# Patient Record
Sex: Female | Born: 1960
Health system: Southern US, Community
[De-identification: ages and names within clinical notes are randomized; demographics above are authoritative.]

## PROBLEM LIST (undated history)

## (undated) HISTORY — PX: LEEP: SHX91

---

## 2008-01-04 DIAGNOSIS — R011 Cardiac murmur, unspecified: Secondary | ICD-10-CM | POA: Insufficient documentation

## 2014-02-11 DIAGNOSIS — L719 Rosacea, unspecified: Secondary | ICD-10-CM | POA: Insufficient documentation

## 2015-02-14 DIAGNOSIS — F17200 Nicotine dependence, unspecified, uncomplicated: Secondary | ICD-10-CM | POA: Insufficient documentation

## 2016-01-02 DIAGNOSIS — K635 Polyp of colon: Secondary | ICD-10-CM | POA: Diagnosis not present

## 2016-01-02 DIAGNOSIS — Z0000 Encounter for general adult medical examination without abnormal findings: Secondary | ICD-10-CM | POA: Diagnosis not present

## 2016-01-02 DIAGNOSIS — R933 Abnormal findings on diagnostic imaging of other parts of digestive tract: Secondary | ICD-10-CM | POA: Diagnosis not present

## 2016-01-02 DIAGNOSIS — D229 Melanocytic nevi, unspecified: Secondary | ICD-10-CM | POA: Diagnosis not present

## 2016-02-26 DIAGNOSIS — R300 Dysuria: Secondary | ICD-10-CM | POA: Diagnosis not present

## 2016-02-26 DIAGNOSIS — N3001 Acute cystitis with hematuria: Secondary | ICD-10-CM | POA: Diagnosis not present

## 2016-03-03 DIAGNOSIS — R102 Pelvic and perineal pain: Secondary | ICD-10-CM | POA: Diagnosis not present

## 2016-03-03 DIAGNOSIS — R319 Hematuria, unspecified: Secondary | ICD-10-CM | POA: Diagnosis not present

## 2016-03-03 DIAGNOSIS — R1032 Left lower quadrant pain: Secondary | ICD-10-CM | POA: Diagnosis not present

## 2016-03-04 ENCOUNTER — Emergency Department
Admission: EM | Admit: 2016-03-04 | Discharge: 2016-03-04 | Disposition: A | Discharge status: Home / Self Care | Payer: 59 | Source: Home / Self Care | Attending: Family Medicine | Admitting: Family Medicine

## 2016-03-04 ENCOUNTER — Emergency Department (INDEPENDENT_AMBULATORY_CARE_PROVIDER_SITE_OTHER): Payer: 59

## 2016-03-04 ENCOUNTER — Encounter: Payer: Self-pay | Admitting: Emergency Medicine

## 2016-03-04 DIAGNOSIS — I700 Atherosclerosis of aorta: Secondary | ICD-10-CM | POA: Diagnosis not present

## 2016-03-04 DIAGNOSIS — R1032 Left lower quadrant pain: Secondary | ICD-10-CM | POA: Diagnosis not present

## 2016-03-04 DIAGNOSIS — K5730 Diverticulosis of large intestine without perforation or abscess without bleeding: Secondary | ICD-10-CM | POA: Diagnosis not present

## 2016-03-04 DIAGNOSIS — R319 Hematuria, unspecified: Secondary | ICD-10-CM

## 2016-03-04 DIAGNOSIS — K5732 Diverticulitis of large intestine without perforation or abscess without bleeding: Secondary | ICD-10-CM | POA: Diagnosis not present

## 2016-03-04 LAB — POCT URINALYSIS DIP (MANUAL ENTRY)
Bilirubin, UA: NEGATIVE
Glucose, UA: NEGATIVE
Leukocytes, UA: NEGATIVE
Nitrite, UA: NEGATIVE
Protein Ur, POC: NEGATIVE
Spec Grav, UA: 1.01 (ref 1.005–1.03)
Urobilinogen, UA: 0.2 (ref 0–1)
pH, UA: 6.5 (ref 5–8)

## 2016-03-04 MED ORDER — PREDNISONE 20 MG PO TABS: ORAL_TABLET | ORAL | Status: DC

## 2016-03-04 MED ORDER — AMOXICILLIN-POT CLAVULANATE 875-125 MG PO TABS: 1.0000 | ORAL_TABLET | Freq: Two times a day (BID) | ORAL | Status: DC

## 2016-03-04 NOTE — ED Notes (Signed)
Hematuria, dysuria, lower abdominal pain x 1 week

## 2016-03-04 NOTE — ED Provider Notes (Signed)
CSN: [id number]     Arrival date & time 03/04/16  1416 History   First MD Initiated Contact with Patient 03/04/16 1445     Chief Complaint  Patient presents with  . Hematuria   (Consider location/radiation/quality/duration/timing/severity/associated sxs/prior Treatment) HPI The pt is a 55yo female presenting to [facility] with c/o hematuria for about 1 week, mild dysuria and lower abdominal pain.  Pt was seen 1 week ago and treated with Macrobid for a UTI.  She was called by the urgent care about her culture results and was advised she was on the correct antibiotic. She has 2 days left but is concerned there is pain and still blood in her urine.  She went to her OB/GYN earlier this week and was referred to urology due to concern for possible kidney stone.  Urologist recommended a CT abdomen with oral contrast, however, the urologist was not in her insurance network so she declined but is here today as symptoms have not improved. Pain in Left lower abdomen are cramping and sore, 4/10 at this time. Nothing seems to make pain better or worse. Denies vaginal symptoms and states pelvic exam at OB/GYN was normal.  Denies back pain, fever, chills, n/v/d. She does report hx of diverticulitis but notes this feels a little different and also recalls she is on Flagyl along with the [place] but still not improving.  Pt notes she is concerned as she had a friend pass away from bladder cancer and wants to make sure that's not what's causing her pain.    History reviewed. No pertinent past medical history. Past Surgical History  Procedure Laterality Date  . Leep     No family history on file. Social History  Substance Use Topics  . Smoking status: Current Every Day Smoker -- 0.50 packs/day    Types: Cigarettes  . Smokeless tobacco: None  . Alcohol Use: Yes   OB History    No data available     Review of Systems  Constitutional: Negative for fever and chills.  HENT: Negative for congestion, ear pain, sore  throat, trouble swallowing and voice change.   Respiratory: Negative for shortness of breath.   Cardiovascular: Negative for chest pain and palpitations.  Gastrointestinal: Negative for nausea, vomiting, abdominal pain and diarrhea.  Genitourinary: Positive for dysuria and hematuria. Negative for urgency, frequency, flank pain, vaginal bleeding, vaginal discharge, vaginal pain and pelvic pain.  Musculoskeletal: Negative for myalgias, back pain and arthralgias.  Skin: Negative for rash.    Allergies  Avelox and Ciprofloxacin  Home Medications   Prior to Admission medications   Medication Sig Start Date End Date Taking? Authorizing Provider  aspirin 81 MG tablet Take 81 mg by mouth daily.   Yes Historical Provider, MD  metroNIDAZOLE (FLAGYL) 500 MG tablet Take 500 mg by mouth 3 (three) times daily.   Yes Historical Provider, MD  nitrofurantoin (MACRODANTIN) 100 MG capsule Take 100 mg by mouth 4 (four) times daily.   Yes Historical Provider, MD  amoxicillin-clavulanate (AUGMENTIN) 875-125 MG tablet Take 1 tablet by mouth 2 (two) times daily. One po bid x 7 days 03/04/16   Noland Fordyce, PA-C  predniSONE (DELTASONE) 20 MG tablet 3 tabs po day one, then 2 po daily x 4 days 03/04/16   Noland Fordyce, PA-C   Meds Ordered and Administered this Visit  Medications - No data to display  BP 120/75 mmHg  Pulse 84  Temp(Src) 97.9 F (36.6 C) (Oral)  Ht 5\' 1"  (1.549 m)  Wt 129 lb (58.514 kg)  BMI 24.39 kg/m2  SpO2 98% No data found.   Physical Exam  Constitutional: She appears well-developed and well-nourished. No distress.  HENT:  Head: Normocephalic and atraumatic.  Eyes: Conjunctivae are normal. No scleral icterus.  Neck: Normal range of motion.  Cardiovascular: Normal rate, regular rhythm and normal heart sounds.   Pulmonary/Chest: Effort normal and breath sounds normal. No respiratory distress. She has no wheezes. She has no rales. She exhibits no tenderness.  Abdominal: Soft. She  exhibits no distension and no mass. There is tenderness. There is no rebound, no guarding and no CVA tenderness.  Musculoskeletal: Normal range of motion.  Neurological: She is alert.  Skin: Skin is warm and dry. She is not diaphoretic.  Nursing note and vitals reviewed.   ED Course  Procedures (including critical care time)  Labs Review Labs Reviewed  POCT URINALYSIS DIP (MANUAL ENTRY) - Abnormal; Notable for the following:    Ketones, POC UA trace (5) (*)    Blood, UA small (*)    All other components within normal limits    Imaging Review Ct Abdomen Pelvis Wo Contrast  03/04/2016  CLINICAL DATA:  Lower abdominal pain, blood in during for 1 week, refused IV contrast EXAM: CT ABDOMEN AND PELVIS WITHOUT CONTRAST TECHNIQUE: Multidetector CT imaging of the abdomen and pelvis was performed following the standard protocol without IV contrast. COMPARISON:  None. FINDINGS: Lower chest:  Lung bases are unremarkable. Hepatobiliary: Unenhanced liver shows no biliary ductal dilatation. No calcified gallstones are noted within gallbladder. Pancreas: Unenhanced pancreas is unremarkable. Spleen: Unenhanced spleen is unremarkable. Adrenals/Urinary Tract: Unenhanced adrenal glands are unremarkable. Unenhanced kidneys are symmetrical in size. No nephrolithiasis. No hydronephrosis or hydroureter. No calcified ureteral calculi. No calcified calculi are noted within under distended urinary bladder. Stomach/Bowel: Oral contrast material was given to the patient. There is no gastric outlet obstruction. No small bowel obstruction. No thickened or dilated small bowel loops. No pericecal inflammation. Normal appendix noted in axial image 59. The terminal ileum is unremarkable. No colonic obstruction. Colonic diverticula are noted descending colon. Multiple colonic diverticula are noted proximal sigmoid colon. In axial a image 68 there is inflammatory process in distal left colon at the junction with sigmoid colon. There  is focal thickening of colonic wall. Stranding of pericolonic fat. Findings are consistent with focal colitis or diverticulitis. This is best seen in coronal image 30. There is no evidence of extraluminal air. No contrast extravasation is noted. Vascular/Lymphatic: No aortic aneurysm. Atherosclerotic calcifications of abdominal aorta and iliac arteries are noted. No retroperitoneal or mesenteric adenopathy. Reproductive: The unenhanced uterus is anteflexed. No adnexal masses noted. Other: No ascites or free abdominal air.  No inguinal adenopathy. Musculoskeletal: Sagittal images of the spine shows mild degenerative changes lower thoracic and lumbar spine. No destructive bony lesions are noted. IMPRESSION: 1. Colonic diverticula are noted descending colon and sigmoid colon. There is focal inflammatory process at the junction of descending colon with sigmoid colon in left lower quadrant axial image 59 and 60. Focal thickening of colonic wall and stranding of pericolonic fat. Findings are consistent with focal colitis or diverticulitis. There is no definite evidence of perforation. No mesenteric abscess is noted. 2. Normal appendix.  No pericecal inflammation. 3. No nephrolithiasis.  No hydronephrosis or hydroureter. 4. No small bowel obstruction. 5. Atherosclerotic calcifications of abdominal aorta. Electronically Signed   By: Lahoma Crocker M.D.   On: 03/04/2016 16:53     MDM   1. Hematuria  2. Left lower quadrant pain   3. Diverticulitis of colon    Pt c/o Left lower abdominal pain and hematuria. Was recommended she get a CT by a urologist earlier this week. Had a normal pelvic exam by her OB/GYN earlier this week.  Hx of diverticulitis.   CT ordered to r/o diverticulitis vs mass  Pt declined IV contrast with the CT as she is concerned for kidney side effects despite having hx of diabetes or CKD.  CT Abd: c/w diverticulitis and colitis without abscess or perforation.  No other acute findings.  Rx:  Augmentin and prednisone as pt states she has been on Flagyl but no relief of symptoms. Encouraged to take her last dose of Flagyl and Macrobid tonight and then start taking Augmentin. Pt declined stronger pain medication. Will take Advil and acetaminophen.   F/u with PCP next week for recheck of symptoms if not improving. Encouraged to discuss f/u colonoscopy with her PCP.  Resources provided for [facility]. Encouraged to f/u if hematuria continues after completion of antibiotics.   Discussed symptoms that warrant emergent care in the ED. Patient verbalized understanding and agreement with treatment plan.    Noland Fordyce, PA-C 03/04/16 1714

## 2016-03-04 NOTE — Discharge Instructions (Signed)
You may take 400-600mg  Ibuprofen (Motrin) every 6-8 hours for fever and pain  Alternate with Tylenol  You may take 500mg  Tylenol every 4-6 hours as needed for fever and pain  Follow-up with your primary care provider next week for recheck of symptoms if not improving.  Be sure to drink plenty of fluids and rest, at least 8hrs of sleep a night, preferably more while you are sick. Return urgent care or go to closest ER if you cannot keep down fluids/signs of dehydration, fever not reducing with Tylenol, difficulty breathing/wheezing, stiff neck, worsening condition, or other concerns (see below)  Please take antibiotics as prescribed and be sure to complete entire course even if you start to feel better to ensure infection does not come back.   Please be sure to complete your antibiotics that you were prescribed at the other facility as well as the new medication you were prescribed today.  It is recommended you get a colonoscopy after diagnosis of diverticulitis.  Please speak with your primary care provider about following up with a colonoscopy.

## 2016-03-07 ENCOUNTER — Telehealth: Payer: Self-pay

## 2016-04-16 ENCOUNTER — Ambulatory Visit: Payer: 59 | Admitting: Podiatry

## 2016-05-14 DIAGNOSIS — Z1231 Encounter for screening mammogram for malignant neoplasm of breast: Secondary | ICD-10-CM | POA: Diagnosis not present

## 2016-06-04 DIAGNOSIS — R238 Other skin changes: Secondary | ICD-10-CM | POA: Diagnosis not present

## 2016-06-04 DIAGNOSIS — N9089 Other specified noninflammatory disorders of vulva and perineum: Secondary | ICD-10-CM | POA: Diagnosis not present

## 2016-06-04 DIAGNOSIS — N898 Other specified noninflammatory disorders of vagina: Secondary | ICD-10-CM | POA: Diagnosis not present

## 2016-06-04 DIAGNOSIS — Z01419 Encounter for gynecological examination (general) (routine) without abnormal findings: Secondary | ICD-10-CM | POA: Diagnosis not present

## 2016-06-04 DIAGNOSIS — Z1151 Encounter for screening for human papillomavirus (HPV): Secondary | ICD-10-CM | POA: Diagnosis not present

## 2016-07-05 DIAGNOSIS — L578 Other skin changes due to chronic exposure to nonionizing radiation: Secondary | ICD-10-CM | POA: Diagnosis not present

## 2016-07-05 DIAGNOSIS — L821 Other seborrheic keratosis: Secondary | ICD-10-CM | POA: Diagnosis not present

## 2016-07-30 DIAGNOSIS — Z86010 Personal history of colonic polyps: Secondary | ICD-10-CM | POA: Diagnosis not present

## 2016-07-30 DIAGNOSIS — Z1211 Encounter for screening for malignant neoplasm of colon: Secondary | ICD-10-CM | POA: Diagnosis not present

## 2016-07-30 DIAGNOSIS — K621 Rectal polyp: Secondary | ICD-10-CM | POA: Diagnosis not present

## 2016-11-26 DIAGNOSIS — H527 Unspecified disorder of refraction: Secondary | ICD-10-CM | POA: Diagnosis not present

## 2017-01-06 DIAGNOSIS — C50919 Malignant neoplasm of unspecified site of unspecified female breast: Secondary | ICD-10-CM

## 2017-01-06 HISTORY — DX: Malignant neoplasm of unspecified site of unspecified female breast: C50.919

## 2017-01-13 DIAGNOSIS — Q839 Congenital malformation of breast, unspecified: Secondary | ICD-10-CM | POA: Diagnosis not present

## 2017-01-13 DIAGNOSIS — N6459 Other signs and symptoms in breast: Secondary | ICD-10-CM | POA: Diagnosis not present

## 2017-01-21 DIAGNOSIS — N6323 Unspecified lump in the left breast, lower outer quadrant: Secondary | ICD-10-CM | POA: Diagnosis not present

## 2017-01-21 DIAGNOSIS — N6452 Nipple discharge: Secondary | ICD-10-CM | POA: Diagnosis not present

## 2017-01-26 DIAGNOSIS — Z9889 Other specified postprocedural states: Secondary | ICD-10-CM | POA: Diagnosis not present

## 2017-01-26 DIAGNOSIS — R590 Localized enlarged lymph nodes: Secondary | ICD-10-CM | POA: Diagnosis not present

## 2017-01-26 DIAGNOSIS — N63 Unspecified lump in unspecified breast: Secondary | ICD-10-CM | POA: Diagnosis not present

## 2017-01-26 DIAGNOSIS — N6459 Other signs and symptoms in breast: Secondary | ICD-10-CM | POA: Diagnosis not present

## 2017-01-26 DIAGNOSIS — N6323 Unspecified lump in the left breast, lower outer quadrant: Secondary | ICD-10-CM | POA: Diagnosis not present

## 2017-01-27 DIAGNOSIS — C50912 Malignant neoplasm of unspecified site of left female breast: Secondary | ICD-10-CM | POA: Diagnosis not present

## 2017-01-27 DIAGNOSIS — D0512 Intraductal carcinoma in situ of left breast: Secondary | ICD-10-CM | POA: Diagnosis not present

## 2017-01-31 DIAGNOSIS — C50812 Malignant neoplasm of overlapping sites of left female breast: Secondary | ICD-10-CM | POA: Diagnosis not present

## 2017-01-31 DIAGNOSIS — Z170 Estrogen receptor positive status [ER+]: Secondary | ICD-10-CM | POA: Diagnosis not present

## 2017-02-02 DIAGNOSIS — C50912 Malignant neoplasm of unspecified site of left female breast: Secondary | ICD-10-CM | POA: Diagnosis not present

## 2017-02-02 DIAGNOSIS — Z170 Estrogen receptor positive status [ER+]: Secondary | ICD-10-CM | POA: Insufficient documentation

## 2017-02-02 DIAGNOSIS — C50812 Malignant neoplasm of overlapping sites of left female breast: Secondary | ICD-10-CM | POA: Diagnosis not present

## 2017-02-02 DIAGNOSIS — C773 Secondary and unspecified malignant neoplasm of axilla and upper limb lymph nodes: Secondary | ICD-10-CM | POA: Diagnosis not present

## 2017-02-02 DIAGNOSIS — C50919 Malignant neoplasm of unspecified site of unspecified female breast: Secondary | ICD-10-CM | POA: Diagnosis not present

## 2017-02-18 DIAGNOSIS — Z170 Estrogen receptor positive status [ER+]: Secondary | ICD-10-CM | POA: Diagnosis not present

## 2017-02-18 DIAGNOSIS — C50812 Malignant neoplasm of overlapping sites of left female breast: Secondary | ICD-10-CM | POA: Diagnosis not present

## 2017-02-21 DIAGNOSIS — C50912 Malignant neoplasm of unspecified site of left female breast: Secondary | ICD-10-CM | POA: Diagnosis not present

## 2017-02-21 DIAGNOSIS — C50412 Malignant neoplasm of upper-outer quadrant of left female breast: Secondary | ICD-10-CM | POA: Diagnosis not present

## 2017-02-21 DIAGNOSIS — Z01810 Encounter for preprocedural cardiovascular examination: Secondary | ICD-10-CM | POA: Diagnosis not present

## 2017-02-21 DIAGNOSIS — Z7982 Long term (current) use of aspirin: Secondary | ICD-10-CM | POA: Diagnosis not present

## 2017-02-21 DIAGNOSIS — Z87891 Personal history of nicotine dependence: Secondary | ICD-10-CM | POA: Diagnosis not present

## 2017-02-21 DIAGNOSIS — C50512 Malignant neoplasm of lower-outer quadrant of left female breast: Secondary | ICD-10-CM | POA: Diagnosis not present

## 2017-02-21 DIAGNOSIS — Z9889 Other specified postprocedural states: Secondary | ICD-10-CM | POA: Diagnosis not present

## 2017-02-21 DIAGNOSIS — Z79899 Other long term (current) drug therapy: Secondary | ICD-10-CM | POA: Diagnosis not present

## 2017-02-21 DIAGNOSIS — Z170 Estrogen receptor positive status [ER+]: Secondary | ICD-10-CM | POA: Diagnosis not present

## 2017-02-25 DIAGNOSIS — Z01810 Encounter for preprocedural cardiovascular examination: Secondary | ICD-10-CM | POA: Diagnosis not present

## 2017-02-25 DIAGNOSIS — Z170 Estrogen receptor positive status [ER+]: Secondary | ICD-10-CM | POA: Diagnosis not present

## 2017-02-25 DIAGNOSIS — C50812 Malignant neoplasm of overlapping sites of left female breast: Secondary | ICD-10-CM | POA: Diagnosis not present

## 2017-02-25 DIAGNOSIS — I313 Pericardial effusion (noninflammatory): Secondary | ICD-10-CM | POA: Diagnosis not present

## 2017-03-01 DIAGNOSIS — C773 Secondary and unspecified malignant neoplasm of axilla and upper limb lymph nodes: Secondary | ICD-10-CM | POA: Diagnosis not present

## 2017-03-01 DIAGNOSIS — Z780 Asymptomatic menopausal state: Secondary | ICD-10-CM | POA: Diagnosis not present

## 2017-03-01 DIAGNOSIS — L719 Rosacea, unspecified: Secondary | ICD-10-CM | POA: Diagnosis not present

## 2017-03-01 DIAGNOSIS — F419 Anxiety disorder, unspecified: Secondary | ICD-10-CM | POA: Diagnosis not present

## 2017-03-01 DIAGNOSIS — Z95828 Presence of other vascular implants and grafts: Secondary | ICD-10-CM | POA: Diagnosis not present

## 2017-03-01 DIAGNOSIS — R011 Cardiac murmur, unspecified: Secondary | ICD-10-CM | POA: Diagnosis not present

## 2017-03-01 DIAGNOSIS — Z87891 Personal history of nicotine dependence: Secondary | ICD-10-CM | POA: Diagnosis not present

## 2017-03-01 DIAGNOSIS — Z170 Estrogen receptor positive status [ER+]: Secondary | ICD-10-CM | POA: Diagnosis not present

## 2017-03-01 DIAGNOSIS — C50812 Malignant neoplasm of overlapping sites of left female breast: Secondary | ICD-10-CM | POA: Diagnosis not present

## 2017-03-08 DIAGNOSIS — C50812 Malignant neoplasm of overlapping sites of left female breast: Secondary | ICD-10-CM | POA: Diagnosis not present

## 2017-03-08 DIAGNOSIS — N6323 Unspecified lump in the left breast, lower outer quadrant: Secondary | ICD-10-CM | POA: Diagnosis not present

## 2017-03-08 DIAGNOSIS — Z170 Estrogen receptor positive status [ER+]: Secondary | ICD-10-CM | POA: Diagnosis not present

## 2017-03-10 DIAGNOSIS — Z5111 Encounter for antineoplastic chemotherapy: Secondary | ICD-10-CM | POA: Diagnosis not present

## 2017-03-10 DIAGNOSIS — Z95828 Presence of other vascular implants and grafts: Secondary | ICD-10-CM | POA: Diagnosis not present

## 2017-03-10 DIAGNOSIS — Z5112 Encounter for antineoplastic immunotherapy: Secondary | ICD-10-CM | POA: Diagnosis not present

## 2017-03-10 DIAGNOSIS — C50812 Malignant neoplasm of overlapping sites of left female breast: Secondary | ICD-10-CM | POA: Diagnosis not present

## 2017-03-10 DIAGNOSIS — Z7689 Persons encountering health services in other specified circumstances: Secondary | ICD-10-CM | POA: Diagnosis not present

## 2017-03-10 DIAGNOSIS — Z170 Estrogen receptor positive status [ER+]: Secondary | ICD-10-CM | POA: Diagnosis not present

## 2017-03-15 DIAGNOSIS — Z79899 Other long term (current) drug therapy: Secondary | ICD-10-CM | POA: Diagnosis not present

## 2017-03-15 DIAGNOSIS — Z95828 Presence of other vascular implants and grafts: Secondary | ICD-10-CM | POA: Diagnosis not present

## 2017-03-15 DIAGNOSIS — Z170 Estrogen receptor positive status [ER+]: Secondary | ICD-10-CM | POA: Diagnosis not present

## 2017-03-15 DIAGNOSIS — C50812 Malignant neoplasm of overlapping sites of left female breast: Secondary | ICD-10-CM | POA: Diagnosis not present

## 2017-03-16 DIAGNOSIS — D6959 Other secondary thrombocytopenia: Secondary | ICD-10-CM | POA: Diagnosis not present

## 2017-03-16 DIAGNOSIS — D709 Neutropenia, unspecified: Secondary | ICD-10-CM | POA: Diagnosis not present

## 2017-03-16 DIAGNOSIS — R509 Fever, unspecified: Secondary | ICD-10-CM | POA: Diagnosis not present

## 2017-03-16 DIAGNOSIS — D0592 Unspecified type of carcinoma in situ of left breast: Secondary | ICD-10-CM | POA: Diagnosis not present

## 2017-03-16 DIAGNOSIS — B370 Candidal stomatitis: Secondary | ICD-10-CM | POA: Diagnosis not present

## 2017-03-16 DIAGNOSIS — D696 Thrombocytopenia, unspecified: Secondary | ICD-10-CM | POA: Diagnosis not present

## 2017-03-16 DIAGNOSIS — K5732 Diverticulitis of large intestine without perforation or abscess without bleeding: Secondary | ICD-10-CM | POA: Diagnosis not present

## 2017-03-16 DIAGNOSIS — Z79899 Other long term (current) drug therapy: Secondary | ICD-10-CM | POA: Diagnosis not present

## 2017-03-16 DIAGNOSIS — M898X9 Other specified disorders of bone, unspecified site: Secondary | ICD-10-CM | POA: Diagnosis not present

## 2017-03-16 DIAGNOSIS — R1032 Left lower quadrant pain: Secondary | ICD-10-CM | POA: Diagnosis not present

## 2017-03-16 DIAGNOSIS — K5730 Diverticulosis of large intestine without perforation or abscess without bleeding: Secondary | ICD-10-CM | POA: Diagnosis not present

## 2017-03-16 DIAGNOSIS — Z87891 Personal history of nicotine dependence: Secondary | ICD-10-CM | POA: Diagnosis not present

## 2017-03-16 DIAGNOSIS — K2100 Gastro-esophageal reflux disease with esophagitis, without bleeding: Secondary | ICD-10-CM | POA: Insufficient documentation

## 2017-03-16 DIAGNOSIS — C50912 Malignant neoplasm of unspecified site of left female breast: Secondary | ICD-10-CM | POA: Diagnosis not present

## 2017-03-16 DIAGNOSIS — Z8719 Personal history of other diseases of the digestive system: Secondary | ICD-10-CM | POA: Diagnosis not present

## 2017-03-16 DIAGNOSIS — R5081 Fever presenting with conditions classified elsewhere: Secondary | ICD-10-CM | POA: Diagnosis not present

## 2017-03-16 DIAGNOSIS — C50812 Malignant neoplasm of overlapping sites of left female breast: Secondary | ICD-10-CM | POA: Diagnosis not present

## 2017-03-16 DIAGNOSIS — Z803 Family history of malignant neoplasm of breast: Secondary | ICD-10-CM | POA: Diagnosis not present

## 2017-03-16 DIAGNOSIS — R310 Gross hematuria: Secondary | ICD-10-CM | POA: Diagnosis not present

## 2017-03-16 DIAGNOSIS — Z170 Estrogen receptor positive status [ER+]: Secondary | ICD-10-CM | POA: Diagnosis not present

## 2017-03-21 DIAGNOSIS — Z170 Estrogen receptor positive status [ER+]: Secondary | ICD-10-CM | POA: Diagnosis not present

## 2017-03-21 DIAGNOSIS — Z79899 Other long term (current) drug therapy: Secondary | ICD-10-CM | POA: Diagnosis not present

## 2017-03-21 DIAGNOSIS — Z95828 Presence of other vascular implants and grafts: Secondary | ICD-10-CM | POA: Diagnosis not present

## 2017-03-21 DIAGNOSIS — C50812 Malignant neoplasm of overlapping sites of left female breast: Secondary | ICD-10-CM | POA: Diagnosis not present

## 2017-03-25 DIAGNOSIS — C50812 Malignant neoplasm of overlapping sites of left female breast: Secondary | ICD-10-CM | POA: Diagnosis not present

## 2017-03-25 DIAGNOSIS — Z170 Estrogen receptor positive status [ER+]: Secondary | ICD-10-CM | POA: Diagnosis not present

## 2017-03-25 DIAGNOSIS — Z79899 Other long term (current) drug therapy: Secondary | ICD-10-CM | POA: Diagnosis not present

## 2017-03-31 DIAGNOSIS — Z79811 Long term (current) use of aromatase inhibitors: Secondary | ICD-10-CM | POA: Insufficient documentation

## 2017-03-31 DIAGNOSIS — C50812 Malignant neoplasm of overlapping sites of left female breast: Secondary | ICD-10-CM | POA: Diagnosis not present

## 2017-03-31 DIAGNOSIS — Z7689 Persons encountering health services in other specified circumstances: Secondary | ICD-10-CM | POA: Diagnosis not present

## 2017-03-31 DIAGNOSIS — Z5112 Encounter for antineoplastic immunotherapy: Secondary | ICD-10-CM | POA: Diagnosis not present

## 2017-03-31 DIAGNOSIS — R011 Cardiac murmur, unspecified: Secondary | ICD-10-CM | POA: Diagnosis not present

## 2017-03-31 DIAGNOSIS — L659 Nonscarring hair loss, unspecified: Secondary | ICD-10-CM | POA: Diagnosis not present

## 2017-03-31 DIAGNOSIS — Z170 Estrogen receptor positive status [ER+]: Secondary | ICD-10-CM | POA: Diagnosis not present

## 2017-03-31 DIAGNOSIS — C773 Secondary and unspecified malignant neoplasm of axilla and upper limb lymph nodes: Secondary | ICD-10-CM | POA: Diagnosis not present

## 2017-03-31 DIAGNOSIS — Z95828 Presence of other vascular implants and grafts: Secondary | ICD-10-CM | POA: Diagnosis not present

## 2017-03-31 DIAGNOSIS — Z5111 Encounter for antineoplastic chemotherapy: Secondary | ICD-10-CM | POA: Diagnosis not present

## 2017-04-01 DIAGNOSIS — Z95828 Presence of other vascular implants and grafts: Secondary | ICD-10-CM | POA: Diagnosis not present

## 2017-04-04 DIAGNOSIS — C50812 Malignant neoplasm of overlapping sites of left female breast: Secondary | ICD-10-CM | POA: Diagnosis not present

## 2017-04-05 DIAGNOSIS — Z5111 Encounter for antineoplastic chemotherapy: Secondary | ICD-10-CM | POA: Diagnosis not present

## 2017-04-05 DIAGNOSIS — Z170 Estrogen receptor positive status [ER+]: Secondary | ICD-10-CM | POA: Diagnosis not present

## 2017-04-05 DIAGNOSIS — C50812 Malignant neoplasm of overlapping sites of left female breast: Secondary | ICD-10-CM | POA: Diagnosis not present

## 2017-04-06 DIAGNOSIS — Z170 Estrogen receptor positive status [ER+]: Secondary | ICD-10-CM | POA: Diagnosis not present

## 2017-04-06 DIAGNOSIS — C50812 Malignant neoplasm of overlapping sites of left female breast: Secondary | ICD-10-CM | POA: Diagnosis not present

## 2017-04-07 DIAGNOSIS — C50812 Malignant neoplasm of overlapping sites of left female breast: Secondary | ICD-10-CM | POA: Diagnosis not present

## 2017-04-07 DIAGNOSIS — Z170 Estrogen receptor positive status [ER+]: Secondary | ICD-10-CM | POA: Diagnosis not present

## 2017-04-08 DIAGNOSIS — D701 Agranulocytosis secondary to cancer chemotherapy: Secondary | ICD-10-CM | POA: Diagnosis not present

## 2017-04-08 DIAGNOSIS — C773 Secondary and unspecified malignant neoplasm of axilla and upper limb lymph nodes: Secondary | ICD-10-CM | POA: Diagnosis not present

## 2017-04-08 DIAGNOSIS — R5081 Fever presenting with conditions classified elsewhere: Secondary | ICD-10-CM | POA: Diagnosis not present

## 2017-04-08 DIAGNOSIS — D709 Neutropenia, unspecified: Secondary | ICD-10-CM | POA: Diagnosis not present

## 2017-04-08 DIAGNOSIS — L719 Rosacea, unspecified: Secondary | ICD-10-CM | POA: Diagnosis not present

## 2017-04-08 DIAGNOSIS — D696 Thrombocytopenia, unspecified: Secondary | ICD-10-CM | POA: Diagnosis not present

## 2017-04-08 DIAGNOSIS — D6959 Other secondary thrombocytopenia: Secondary | ICD-10-CM | POA: Diagnosis not present

## 2017-04-08 DIAGNOSIS — Z95828 Presence of other vascular implants and grafts: Secondary | ICD-10-CM | POA: Diagnosis not present

## 2017-04-08 DIAGNOSIS — R197 Diarrhea, unspecified: Secondary | ICD-10-CM | POA: Diagnosis not present

## 2017-04-08 DIAGNOSIS — Z170 Estrogen receptor positive status [ER+]: Secondary | ICD-10-CM | POA: Diagnosis not present

## 2017-04-08 DIAGNOSIS — Z09 Encounter for follow-up examination after completed treatment for conditions other than malignant neoplasm: Secondary | ICD-10-CM | POA: Diagnosis not present

## 2017-04-08 DIAGNOSIS — R011 Cardiac murmur, unspecified: Secondary | ICD-10-CM | POA: Diagnosis not present

## 2017-04-08 DIAGNOSIS — D649 Anemia, unspecified: Secondary | ICD-10-CM | POA: Insufficient documentation

## 2017-04-08 DIAGNOSIS — R509 Fever, unspecified: Secondary | ICD-10-CM | POA: Insufficient documentation

## 2017-04-08 DIAGNOSIS — C50812 Malignant neoplasm of overlapping sites of left female breast: Secondary | ICD-10-CM | POA: Diagnosis not present

## 2017-04-08 DIAGNOSIS — K210 Gastro-esophageal reflux disease with esophagitis: Secondary | ICD-10-CM | POA: Diagnosis not present

## 2017-04-08 DIAGNOSIS — Z79899 Other long term (current) drug therapy: Secondary | ICD-10-CM | POA: Diagnosis not present

## 2017-04-08 DIAGNOSIS — C50512 Malignant neoplasm of lower-outer quadrant of left female breast: Secondary | ICD-10-CM | POA: Diagnosis not present

## 2017-04-08 DIAGNOSIS — Z7982 Long term (current) use of aspirin: Secondary | ICD-10-CM | POA: Diagnosis not present

## 2017-04-09 DIAGNOSIS — Z95828 Presence of other vascular implants and grafts: Secondary | ICD-10-CM | POA: Diagnosis not present

## 2017-04-09 DIAGNOSIS — C50812 Malignant neoplasm of overlapping sites of left female breast: Secondary | ICD-10-CM | POA: Diagnosis not present

## 2017-04-09 DIAGNOSIS — Z170 Estrogen receptor positive status [ER+]: Secondary | ICD-10-CM | POA: Diagnosis not present

## 2017-04-10 DIAGNOSIS — Z8719 Personal history of other diseases of the digestive system: Secondary | ICD-10-CM | POA: Diagnosis not present

## 2017-04-10 DIAGNOSIS — C50812 Malignant neoplasm of overlapping sites of left female breast: Secondary | ICD-10-CM | POA: Diagnosis not present

## 2017-04-10 DIAGNOSIS — Z95828 Presence of other vascular implants and grafts: Secondary | ICD-10-CM | POA: Diagnosis not present

## 2017-04-10 DIAGNOSIS — Z170 Estrogen receptor positive status [ER+]: Secondary | ICD-10-CM | POA: Diagnosis not present

## 2017-04-11 DIAGNOSIS — C50812 Malignant neoplasm of overlapping sites of left female breast: Secondary | ICD-10-CM | POA: Diagnosis not present

## 2017-04-11 DIAGNOSIS — Z95828 Presence of other vascular implants and grafts: Secondary | ICD-10-CM | POA: Diagnosis not present

## 2017-04-11 DIAGNOSIS — Z170 Estrogen receptor positive status [ER+]: Secondary | ICD-10-CM | POA: Diagnosis not present

## 2017-04-12 DIAGNOSIS — D649 Anemia, unspecified: Secondary | ICD-10-CM | POA: Diagnosis not present

## 2017-04-12 DIAGNOSIS — C50812 Malignant neoplasm of overlapping sites of left female breast: Secondary | ICD-10-CM | POA: Diagnosis not present

## 2017-04-12 DIAGNOSIS — A0472 Enterocolitis due to Clostridium difficile, not specified as recurrent: Secondary | ICD-10-CM | POA: Insufficient documentation

## 2017-04-12 DIAGNOSIS — R509 Fever, unspecified: Secondary | ICD-10-CM | POA: Diagnosis not present

## 2017-04-12 DIAGNOSIS — Z170 Estrogen receptor positive status [ER+]: Secondary | ICD-10-CM | POA: Diagnosis not present

## 2017-04-12 DIAGNOSIS — Z95828 Presence of other vascular implants and grafts: Secondary | ICD-10-CM | POA: Diagnosis not present

## 2017-04-12 DIAGNOSIS — R197 Diarrhea, unspecified: Secondary | ICD-10-CM | POA: Diagnosis not present

## 2017-04-13 DIAGNOSIS — C50812 Malignant neoplasm of overlapping sites of left female breast: Secondary | ICD-10-CM | POA: Diagnosis not present

## 2017-04-13 DIAGNOSIS — Z170 Estrogen receptor positive status [ER+]: Secondary | ICD-10-CM | POA: Diagnosis not present

## 2017-04-15 DIAGNOSIS — D709 Neutropenia, unspecified: Secondary | ICD-10-CM | POA: Diagnosis not present

## 2017-04-15 DIAGNOSIS — A0472 Enterocolitis due to Clostridium difficile, not specified as recurrent: Secondary | ICD-10-CM | POA: Diagnosis not present

## 2017-04-15 DIAGNOSIS — Z95828 Presence of other vascular implants and grafts: Secondary | ICD-10-CM | POA: Diagnosis not present

## 2017-04-15 DIAGNOSIS — R509 Fever, unspecified: Secondary | ICD-10-CM | POA: Diagnosis not present

## 2017-04-15 DIAGNOSIS — R197 Diarrhea, unspecified: Secondary | ICD-10-CM | POA: Diagnosis not present

## 2017-04-15 DIAGNOSIS — R5081 Fever presenting with conditions classified elsewhere: Secondary | ICD-10-CM | POA: Diagnosis not present

## 2017-04-15 DIAGNOSIS — T451X5A Adverse effect of antineoplastic and immunosuppressive drugs, initial encounter: Secondary | ICD-10-CM | POA: Diagnosis not present

## 2017-04-15 DIAGNOSIS — Z9221 Personal history of antineoplastic chemotherapy: Secondary | ICD-10-CM | POA: Diagnosis not present

## 2017-04-15 DIAGNOSIS — Z170 Estrogen receptor positive status [ER+]: Secondary | ICD-10-CM | POA: Diagnosis not present

## 2017-04-15 DIAGNOSIS — Z7982 Long term (current) use of aspirin: Secondary | ICD-10-CM | POA: Diagnosis not present

## 2017-04-15 DIAGNOSIS — C50812 Malignant neoplasm of overlapping sites of left female breast: Secondary | ICD-10-CM | POA: Diagnosis not present

## 2017-04-19 DIAGNOSIS — L640 Drug-induced androgenic alopecia: Secondary | ICD-10-CM | POA: Diagnosis not present

## 2017-04-19 DIAGNOSIS — C50812 Malignant neoplasm of overlapping sites of left female breast: Secondary | ICD-10-CM | POA: Diagnosis not present

## 2017-04-21 DIAGNOSIS — C50812 Malignant neoplasm of overlapping sites of left female breast: Secondary | ICD-10-CM | POA: Diagnosis not present

## 2017-04-21 DIAGNOSIS — C50512 Malignant neoplasm of lower-outer quadrant of left female breast: Secondary | ICD-10-CM | POA: Diagnosis not present

## 2017-04-21 DIAGNOSIS — D709 Neutropenia, unspecified: Secondary | ICD-10-CM | POA: Diagnosis not present

## 2017-04-21 DIAGNOSIS — D696 Thrombocytopenia, unspecified: Secondary | ICD-10-CM | POA: Diagnosis not present

## 2017-04-21 DIAGNOSIS — Z87891 Personal history of nicotine dependence: Secondary | ICD-10-CM | POA: Diagnosis not present

## 2017-04-21 DIAGNOSIS — Z79899 Other long term (current) drug therapy: Secondary | ICD-10-CM | POA: Diagnosis not present

## 2017-04-21 DIAGNOSIS — Z95828 Presence of other vascular implants and grafts: Secondary | ICD-10-CM | POA: Diagnosis not present

## 2017-04-21 DIAGNOSIS — Z170 Estrogen receptor positive status [ER+]: Secondary | ICD-10-CM | POA: Diagnosis not present

## 2017-04-21 DIAGNOSIS — A0472 Enterocolitis due to Clostridium difficile, not specified as recurrent: Secondary | ICD-10-CM | POA: Diagnosis not present

## 2017-04-21 DIAGNOSIS — D649 Anemia, unspecified: Secondary | ICD-10-CM | POA: Diagnosis not present

## 2017-04-21 DIAGNOSIS — Z9221 Personal history of antineoplastic chemotherapy: Secondary | ICD-10-CM | POA: Diagnosis not present

## 2017-04-27 DIAGNOSIS — Z95828 Presence of other vascular implants and grafts: Secondary | ICD-10-CM | POA: Diagnosis not present

## 2017-04-27 DIAGNOSIS — Z7982 Long term (current) use of aspirin: Secondary | ICD-10-CM | POA: Diagnosis not present

## 2017-04-27 DIAGNOSIS — Z79899 Other long term (current) drug therapy: Secondary | ICD-10-CM | POA: Diagnosis not present

## 2017-04-27 DIAGNOSIS — C50812 Malignant neoplasm of overlapping sites of left female breast: Secondary | ICD-10-CM | POA: Diagnosis not present

## 2017-04-27 DIAGNOSIS — Z5111 Encounter for antineoplastic chemotherapy: Secondary | ICD-10-CM | POA: Diagnosis not present

## 2017-04-27 DIAGNOSIS — D649 Anemia, unspecified: Secondary | ICD-10-CM | POA: Diagnosis not present

## 2017-04-27 DIAGNOSIS — Z170 Estrogen receptor positive status [ER+]: Secondary | ICD-10-CM | POA: Diagnosis not present

## 2017-04-27 DIAGNOSIS — K210 Gastro-esophageal reflux disease with esophagitis: Secondary | ICD-10-CM | POA: Diagnosis not present

## 2017-04-27 DIAGNOSIS — A0472 Enterocolitis due to Clostridium difficile, not specified as recurrent: Secondary | ICD-10-CM | POA: Diagnosis not present

## 2017-04-27 DIAGNOSIS — C773 Secondary and unspecified malignant neoplasm of axilla and upper limb lymph nodes: Secondary | ICD-10-CM | POA: Diagnosis not present

## 2017-04-27 DIAGNOSIS — R197 Diarrhea, unspecified: Secondary | ICD-10-CM | POA: Diagnosis not present

## 2017-04-27 DIAGNOSIS — Z7952 Long term (current) use of systemic steroids: Secondary | ICD-10-CM | POA: Diagnosis not present

## 2017-04-29 DIAGNOSIS — Z5111 Encounter for antineoplastic chemotherapy: Secondary | ICD-10-CM | POA: Diagnosis not present

## 2017-04-29 DIAGNOSIS — C50812 Malignant neoplasm of overlapping sites of left female breast: Secondary | ICD-10-CM | POA: Diagnosis not present

## 2017-04-29 DIAGNOSIS — Z5112 Encounter for antineoplastic immunotherapy: Secondary | ICD-10-CM | POA: Diagnosis not present

## 2017-04-29 DIAGNOSIS — Z95828 Presence of other vascular implants and grafts: Secondary | ICD-10-CM | POA: Diagnosis not present

## 2017-04-29 DIAGNOSIS — Z170 Estrogen receptor positive status [ER+]: Secondary | ICD-10-CM | POA: Diagnosis not present

## 2017-05-05 DIAGNOSIS — Z5111 Encounter for antineoplastic chemotherapy: Secondary | ICD-10-CM | POA: Diagnosis not present

## 2017-05-05 DIAGNOSIS — D649 Anemia, unspecified: Secondary | ICD-10-CM | POA: Diagnosis not present

## 2017-05-05 DIAGNOSIS — R319 Hematuria, unspecified: Secondary | ICD-10-CM | POA: Diagnosis not present

## 2017-05-05 DIAGNOSIS — Z95828 Presence of other vascular implants and grafts: Secondary | ICD-10-CM | POA: Diagnosis not present

## 2017-05-05 DIAGNOSIS — A0472 Enterocolitis due to Clostridium difficile, not specified as recurrent: Secondary | ICD-10-CM | POA: Diagnosis not present

## 2017-05-05 DIAGNOSIS — Z79899 Other long term (current) drug therapy: Secondary | ICD-10-CM | POA: Diagnosis not present

## 2017-05-05 DIAGNOSIS — Z170 Estrogen receptor positive status [ER+]: Secondary | ICD-10-CM | POA: Diagnosis not present

## 2017-05-05 DIAGNOSIS — C50812 Malignant neoplasm of overlapping sites of left female breast: Secondary | ICD-10-CM | POA: Diagnosis not present

## 2017-05-06 DIAGNOSIS — Z170 Estrogen receptor positive status [ER+]: Secondary | ICD-10-CM | POA: Diagnosis not present

## 2017-05-06 DIAGNOSIS — C50812 Malignant neoplasm of overlapping sites of left female breast: Secondary | ICD-10-CM | POA: Diagnosis not present

## 2017-05-07 DIAGNOSIS — Z95828 Presence of other vascular implants and grafts: Secondary | ICD-10-CM | POA: Diagnosis not present

## 2017-05-07 DIAGNOSIS — Z170 Estrogen receptor positive status [ER+]: Secondary | ICD-10-CM | POA: Diagnosis not present

## 2017-05-07 DIAGNOSIS — C50812 Malignant neoplasm of overlapping sites of left female breast: Secondary | ICD-10-CM | POA: Diagnosis not present

## 2017-05-08 DIAGNOSIS — Z95828 Presence of other vascular implants and grafts: Secondary | ICD-10-CM | POA: Diagnosis not present

## 2017-05-08 DIAGNOSIS — Z170 Estrogen receptor positive status [ER+]: Secondary | ICD-10-CM | POA: Diagnosis not present

## 2017-05-08 DIAGNOSIS — C50812 Malignant neoplasm of overlapping sites of left female breast: Secondary | ICD-10-CM | POA: Diagnosis not present

## 2017-05-09 DIAGNOSIS — T451X5D Adverse effect of antineoplastic and immunosuppressive drugs, subsequent encounter: Secondary | ICD-10-CM | POA: Diagnosis not present

## 2017-05-09 DIAGNOSIS — A0472 Enterocolitis due to Clostridium difficile, not specified as recurrent: Secondary | ICD-10-CM | POA: Diagnosis not present

## 2017-05-09 DIAGNOSIS — C50812 Malignant neoplasm of overlapping sites of left female breast: Secondary | ICD-10-CM | POA: Diagnosis not present

## 2017-05-09 DIAGNOSIS — E875 Hyperkalemia: Secondary | ICD-10-CM | POA: Diagnosis not present

## 2017-05-09 DIAGNOSIS — D61810 Antineoplastic chemotherapy induced pancytopenia: Secondary | ICD-10-CM | POA: Diagnosis not present

## 2017-05-09 DIAGNOSIS — C50412 Malignant neoplasm of upper-outer quadrant of left female breast: Secondary | ICD-10-CM | POA: Diagnosis not present

## 2017-05-09 DIAGNOSIS — Z95828 Presence of other vascular implants and grafts: Secondary | ICD-10-CM | POA: Diagnosis not present

## 2017-05-09 DIAGNOSIS — C773 Secondary and unspecified malignant neoplasm of axilla and upper limb lymph nodes: Secondary | ICD-10-CM | POA: Diagnosis not present

## 2017-05-09 DIAGNOSIS — Z09 Encounter for follow-up examination after completed treatment for conditions other than malignant neoplasm: Secondary | ICD-10-CM | POA: Diagnosis not present

## 2017-05-09 DIAGNOSIS — Z170 Estrogen receptor positive status [ER+]: Secondary | ICD-10-CM | POA: Diagnosis not present

## 2017-05-09 DIAGNOSIS — D696 Thrombocytopenia, unspecified: Secondary | ICD-10-CM | POA: Diagnosis not present

## 2017-05-09 DIAGNOSIS — K210 Gastro-esophageal reflux disease with esophagitis: Secondary | ICD-10-CM | POA: Diagnosis not present

## 2017-05-09 DIAGNOSIS — Z7982 Long term (current) use of aspirin: Secondary | ICD-10-CM | POA: Diagnosis not present

## 2017-05-09 DIAGNOSIS — D649 Anemia, unspecified: Secondary | ICD-10-CM | POA: Diagnosis not present

## 2017-05-18 DIAGNOSIS — L719 Rosacea, unspecified: Secondary | ICD-10-CM | POA: Diagnosis not present

## 2017-05-18 DIAGNOSIS — C773 Secondary and unspecified malignant neoplasm of axilla and upper limb lymph nodes: Secondary | ICD-10-CM | POA: Diagnosis not present

## 2017-05-18 DIAGNOSIS — Z8719 Personal history of other diseases of the digestive system: Secondary | ICD-10-CM | POA: Diagnosis not present

## 2017-05-18 DIAGNOSIS — Z95828 Presence of other vascular implants and grafts: Secondary | ICD-10-CM | POA: Diagnosis not present

## 2017-05-18 DIAGNOSIS — C50812 Malignant neoplasm of overlapping sites of left female breast: Secondary | ICD-10-CM | POA: Diagnosis not present

## 2017-05-18 DIAGNOSIS — K769 Liver disease, unspecified: Secondary | ICD-10-CM | POA: Diagnosis not present

## 2017-05-18 DIAGNOSIS — D649 Anemia, unspecified: Secondary | ICD-10-CM | POA: Diagnosis not present

## 2017-05-18 DIAGNOSIS — B9689 Other specified bacterial agents as the cause of diseases classified elsewhere: Secondary | ICD-10-CM | POA: Diagnosis not present

## 2017-05-18 DIAGNOSIS — Z5111 Encounter for antineoplastic chemotherapy: Secondary | ICD-10-CM | POA: Diagnosis not present

## 2017-05-18 DIAGNOSIS — D630 Anemia in neoplastic disease: Secondary | ICD-10-CM | POA: Diagnosis not present

## 2017-05-18 DIAGNOSIS — Z170 Estrogen receptor positive status [ER+]: Secondary | ICD-10-CM | POA: Diagnosis not present

## 2017-05-20 DIAGNOSIS — Z5111 Encounter for antineoplastic chemotherapy: Secondary | ICD-10-CM | POA: Diagnosis not present

## 2017-05-20 DIAGNOSIS — C50812 Malignant neoplasm of overlapping sites of left female breast: Secondary | ICD-10-CM | POA: Diagnosis not present

## 2017-05-20 DIAGNOSIS — Z170 Estrogen receptor positive status [ER+]: Secondary | ICD-10-CM | POA: Diagnosis not present

## 2017-05-25 DIAGNOSIS — Z95828 Presence of other vascular implants and grafts: Secondary | ICD-10-CM | POA: Diagnosis not present

## 2017-05-25 DIAGNOSIS — Z170 Estrogen receptor positive status [ER+]: Secondary | ICD-10-CM | POA: Diagnosis not present

## 2017-05-25 DIAGNOSIS — A0472 Enterocolitis due to Clostridium difficile, not specified as recurrent: Secondary | ICD-10-CM | POA: Diagnosis not present

## 2017-05-25 DIAGNOSIS — C50812 Malignant neoplasm of overlapping sites of left female breast: Secondary | ICD-10-CM | POA: Diagnosis not present

## 2017-05-26 DIAGNOSIS — C50812 Malignant neoplasm of overlapping sites of left female breast: Secondary | ICD-10-CM | POA: Diagnosis not present

## 2017-05-26 DIAGNOSIS — Z170 Estrogen receptor positive status [ER+]: Secondary | ICD-10-CM | POA: Diagnosis not present

## 2017-05-26 DIAGNOSIS — Z95828 Presence of other vascular implants and grafts: Secondary | ICD-10-CM | POA: Diagnosis not present

## 2017-05-27 DIAGNOSIS — C50812 Malignant neoplasm of overlapping sites of left female breast: Secondary | ICD-10-CM | POA: Diagnosis not present

## 2017-05-27 DIAGNOSIS — Z170 Estrogen receptor positive status [ER+]: Secondary | ICD-10-CM | POA: Diagnosis not present

## 2017-05-30 DIAGNOSIS — Z95828 Presence of other vascular implants and grafts: Secondary | ICD-10-CM | POA: Diagnosis not present

## 2017-05-30 DIAGNOSIS — C50812 Malignant neoplasm of overlapping sites of left female breast: Secondary | ICD-10-CM | POA: Diagnosis not present

## 2017-05-30 DIAGNOSIS — Z170 Estrogen receptor positive status [ER+]: Secondary | ICD-10-CM | POA: Diagnosis not present

## 2017-06-02 DIAGNOSIS — Z170 Estrogen receptor positive status [ER+]: Secondary | ICD-10-CM | POA: Diagnosis not present

## 2017-06-02 DIAGNOSIS — C50812 Malignant neoplasm of overlapping sites of left female breast: Secondary | ICD-10-CM | POA: Diagnosis not present

## 2017-06-02 DIAGNOSIS — D649 Anemia, unspecified: Secondary | ICD-10-CM | POA: Diagnosis not present

## 2017-06-02 DIAGNOSIS — Z95828 Presence of other vascular implants and grafts: Secondary | ICD-10-CM | POA: Diagnosis not present

## 2017-06-10 DIAGNOSIS — Z79899 Other long term (current) drug therapy: Secondary | ICD-10-CM | POA: Diagnosis not present

## 2017-06-10 DIAGNOSIS — C50812 Malignant neoplasm of overlapping sites of left female breast: Secondary | ICD-10-CM | POA: Diagnosis not present

## 2017-06-10 DIAGNOSIS — Z170 Estrogen receptor positive status [ER+]: Secondary | ICD-10-CM | POA: Diagnosis not present

## 2017-06-10 DIAGNOSIS — Z5111 Encounter for antineoplastic chemotherapy: Secondary | ICD-10-CM | POA: Diagnosis not present

## 2017-06-10 DIAGNOSIS — Z95828 Presence of other vascular implants and grafts: Secondary | ICD-10-CM | POA: Diagnosis not present

## 2017-06-16 DIAGNOSIS — Z95828 Presence of other vascular implants and grafts: Secondary | ICD-10-CM | POA: Diagnosis not present

## 2017-06-16 DIAGNOSIS — Z170 Estrogen receptor positive status [ER+]: Secondary | ICD-10-CM | POA: Diagnosis not present

## 2017-06-16 DIAGNOSIS — C50812 Malignant neoplasm of overlapping sites of left female breast: Secondary | ICD-10-CM | POA: Diagnosis not present

## 2017-06-17 DIAGNOSIS — D701 Agranulocytosis secondary to cancer chemotherapy: Secondary | ICD-10-CM | POA: Diagnosis not present

## 2017-06-17 DIAGNOSIS — Z170 Estrogen receptor positive status [ER+]: Secondary | ICD-10-CM | POA: Diagnosis not present

## 2017-06-17 DIAGNOSIS — D696 Thrombocytopenia, unspecified: Secondary | ICD-10-CM | POA: Diagnosis not present

## 2017-06-17 DIAGNOSIS — C50812 Malignant neoplasm of overlapping sites of left female breast: Secondary | ICD-10-CM | POA: Diagnosis not present

## 2017-06-17 DIAGNOSIS — D709 Neutropenia, unspecified: Secondary | ICD-10-CM | POA: Diagnosis not present

## 2017-06-17 DIAGNOSIS — Z09 Encounter for follow-up examination after completed treatment for conditions other than malignant neoplasm: Secondary | ICD-10-CM | POA: Diagnosis not present

## 2017-06-17 DIAGNOSIS — L719 Rosacea, unspecified: Secondary | ICD-10-CM | POA: Diagnosis not present

## 2017-06-17 DIAGNOSIS — T451X5A Adverse effect of antineoplastic and immunosuppressive drugs, initial encounter: Secondary | ICD-10-CM | POA: Diagnosis not present

## 2017-06-17 DIAGNOSIS — R202 Paresthesia of skin: Secondary | ICD-10-CM | POA: Diagnosis not present

## 2017-06-17 DIAGNOSIS — J029 Acute pharyngitis, unspecified: Secondary | ICD-10-CM | POA: Diagnosis not present

## 2017-06-17 DIAGNOSIS — R011 Cardiac murmur, unspecified: Secondary | ICD-10-CM | POA: Diagnosis not present

## 2017-06-17 DIAGNOSIS — C773 Secondary and unspecified malignant neoplasm of axilla and upper limb lymph nodes: Secondary | ICD-10-CM | POA: Diagnosis not present

## 2017-06-17 DIAGNOSIS — D649 Anemia, unspecified: Secondary | ICD-10-CM | POA: Diagnosis not present

## 2017-06-22 DIAGNOSIS — Z170 Estrogen receptor positive status [ER+]: Secondary | ICD-10-CM | POA: Diagnosis not present

## 2017-06-22 DIAGNOSIS — C50812 Malignant neoplasm of overlapping sites of left female breast: Secondary | ICD-10-CM | POA: Diagnosis not present

## 2017-06-24 DIAGNOSIS — Z170 Estrogen receptor positive status [ER+]: Secondary | ICD-10-CM | POA: Diagnosis not present

## 2017-06-24 DIAGNOSIS — C50812 Malignant neoplasm of overlapping sites of left female breast: Secondary | ICD-10-CM | POA: Diagnosis not present

## 2017-07-01 DIAGNOSIS — R509 Fever, unspecified: Secondary | ICD-10-CM | POA: Diagnosis not present

## 2017-07-01 DIAGNOSIS — Z5112 Encounter for antineoplastic immunotherapy: Secondary | ICD-10-CM | POA: Diagnosis not present

## 2017-07-01 DIAGNOSIS — Z5111 Encounter for antineoplastic chemotherapy: Secondary | ICD-10-CM | POA: Diagnosis not present

## 2017-07-01 DIAGNOSIS — Z170 Estrogen receptor positive status [ER+]: Secondary | ICD-10-CM | POA: Diagnosis not present

## 2017-07-01 DIAGNOSIS — C50812 Malignant neoplasm of overlapping sites of left female breast: Secondary | ICD-10-CM | POA: Diagnosis not present

## 2017-07-06 DIAGNOSIS — C50812 Malignant neoplasm of overlapping sites of left female breast: Secondary | ICD-10-CM | POA: Diagnosis not present

## 2017-07-06 DIAGNOSIS — Z95828 Presence of other vascular implants and grafts: Secondary | ICD-10-CM | POA: Diagnosis not present

## 2017-07-06 DIAGNOSIS — Z170 Estrogen receptor positive status [ER+]: Secondary | ICD-10-CM | POA: Diagnosis not present

## 2017-07-07 DIAGNOSIS — C50812 Malignant neoplasm of overlapping sites of left female breast: Secondary | ICD-10-CM | POA: Diagnosis not present

## 2017-07-07 DIAGNOSIS — Z170 Estrogen receptor positive status [ER+]: Secondary | ICD-10-CM | POA: Diagnosis not present

## 2017-07-07 DIAGNOSIS — C50912 Malignant neoplasm of unspecified site of left female breast: Secondary | ICD-10-CM | POA: Diagnosis not present

## 2017-07-08 DIAGNOSIS — E860 Dehydration: Secondary | ICD-10-CM | POA: Diagnosis not present

## 2017-07-08 DIAGNOSIS — Z170 Estrogen receptor positive status [ER+]: Secondary | ICD-10-CM | POA: Diagnosis not present

## 2017-07-08 DIAGNOSIS — C773 Secondary and unspecified malignant neoplasm of axilla and upper limb lymph nodes: Secondary | ICD-10-CM | POA: Diagnosis not present

## 2017-07-08 DIAGNOSIS — Z09 Encounter for follow-up examination after completed treatment for conditions other than malignant neoplasm: Secondary | ICD-10-CM | POA: Diagnosis not present

## 2017-07-08 DIAGNOSIS — Z95828 Presence of other vascular implants and grafts: Secondary | ICD-10-CM | POA: Diagnosis not present

## 2017-07-08 DIAGNOSIS — D701 Agranulocytosis secondary to cancer chemotherapy: Secondary | ICD-10-CM | POA: Diagnosis not present

## 2017-07-08 DIAGNOSIS — D899 Disorder involving the immune mechanism, unspecified: Secondary | ICD-10-CM | POA: Diagnosis not present

## 2017-07-08 DIAGNOSIS — D696 Thrombocytopenia, unspecified: Secondary | ICD-10-CM | POA: Diagnosis not present

## 2017-07-08 DIAGNOSIS — T451X5A Adverse effect of antineoplastic and immunosuppressive drugs, initial encounter: Secondary | ICD-10-CM | POA: Diagnosis not present

## 2017-07-08 DIAGNOSIS — C50812 Malignant neoplasm of overlapping sites of left female breast: Secondary | ICD-10-CM | POA: Diagnosis not present

## 2017-07-08 DIAGNOSIS — A0472 Enterocolitis due to Clostridium difficile, not specified as recurrent: Secondary | ICD-10-CM | POA: Diagnosis not present

## 2017-07-08 DIAGNOSIS — D649 Anemia, unspecified: Secondary | ICD-10-CM | POA: Diagnosis not present

## 2017-07-08 DIAGNOSIS — R5383 Other fatigue: Secondary | ICD-10-CM | POA: Diagnosis not present

## 2017-07-09 DIAGNOSIS — Z170 Estrogen receptor positive status [ER+]: Secondary | ICD-10-CM | POA: Diagnosis not present

## 2017-07-09 DIAGNOSIS — C50812 Malignant neoplasm of overlapping sites of left female breast: Secondary | ICD-10-CM | POA: Diagnosis not present

## 2017-07-09 DIAGNOSIS — Z95828 Presence of other vascular implants and grafts: Secondary | ICD-10-CM | POA: Diagnosis not present

## 2017-07-10 DIAGNOSIS — Z95828 Presence of other vascular implants and grafts: Secondary | ICD-10-CM | POA: Diagnosis not present

## 2017-07-10 DIAGNOSIS — Z170 Estrogen receptor positive status [ER+]: Secondary | ICD-10-CM | POA: Diagnosis not present

## 2017-07-10 DIAGNOSIS — C50812 Malignant neoplasm of overlapping sites of left female breast: Secondary | ICD-10-CM | POA: Diagnosis not present

## 2017-07-13 DIAGNOSIS — Z95828 Presence of other vascular implants and grafts: Secondary | ICD-10-CM | POA: Diagnosis not present

## 2017-07-13 DIAGNOSIS — Z170 Estrogen receptor positive status [ER+]: Secondary | ICD-10-CM | POA: Diagnosis not present

## 2017-07-13 DIAGNOSIS — C50812 Malignant neoplasm of overlapping sites of left female breast: Secondary | ICD-10-CM | POA: Diagnosis not present

## 2017-07-13 DIAGNOSIS — D649 Anemia, unspecified: Secondary | ICD-10-CM | POA: Diagnosis not present

## 2017-07-20 DIAGNOSIS — C773 Secondary and unspecified malignant neoplasm of axilla and upper limb lymph nodes: Secondary | ICD-10-CM | POA: Diagnosis not present

## 2017-07-20 DIAGNOSIS — C50412 Malignant neoplasm of upper-outer quadrant of left female breast: Secondary | ICD-10-CM | POA: Diagnosis not present

## 2017-07-20 DIAGNOSIS — Z170 Estrogen receptor positive status [ER+]: Secondary | ICD-10-CM | POA: Diagnosis not present

## 2017-07-20 DIAGNOSIS — C50812 Malignant neoplasm of overlapping sites of left female breast: Secondary | ICD-10-CM | POA: Diagnosis not present

## 2017-07-20 DIAGNOSIS — Z9221 Personal history of antineoplastic chemotherapy: Secondary | ICD-10-CM | POA: Diagnosis not present

## 2017-07-20 DIAGNOSIS — C50912 Malignant neoplasm of unspecified site of left female breast: Secondary | ICD-10-CM | POA: Diagnosis not present

## 2017-07-23 DIAGNOSIS — C50912 Malignant neoplasm of unspecified site of left female breast: Secondary | ICD-10-CM | POA: Insufficient documentation

## 2017-07-23 DIAGNOSIS — Z9221 Personal history of antineoplastic chemotherapy: Secondary | ICD-10-CM | POA: Insufficient documentation

## 2017-08-04 DIAGNOSIS — C50812 Malignant neoplasm of overlapping sites of left female breast: Secondary | ICD-10-CM | POA: Diagnosis not present

## 2017-08-04 DIAGNOSIS — L719 Rosacea, unspecified: Secondary | ICD-10-CM | POA: Diagnosis not present

## 2017-08-04 DIAGNOSIS — Z170 Estrogen receptor positive status [ER+]: Secondary | ICD-10-CM | POA: Diagnosis not present

## 2017-08-04 DIAGNOSIS — D649 Anemia, unspecified: Secondary | ICD-10-CM | POA: Diagnosis not present

## 2017-08-04 DIAGNOSIS — Z8249 Family history of ischemic heart disease and other diseases of the circulatory system: Secondary | ICD-10-CM | POA: Diagnosis not present

## 2017-08-04 DIAGNOSIS — C773 Secondary and unspecified malignant neoplasm of axilla and upper limb lymph nodes: Secondary | ICD-10-CM | POA: Diagnosis not present

## 2017-08-04 DIAGNOSIS — Z87891 Personal history of nicotine dependence: Secondary | ICD-10-CM | POA: Diagnosis not present

## 2017-08-04 DIAGNOSIS — Z800 Family history of malignant neoplasm of digestive organs: Secondary | ICD-10-CM | POA: Diagnosis not present

## 2017-08-04 DIAGNOSIS — R011 Cardiac murmur, unspecified: Secondary | ICD-10-CM | POA: Diagnosis not present

## 2017-08-04 DIAGNOSIS — C50912 Malignant neoplasm of unspecified site of left female breast: Secondary | ICD-10-CM | POA: Diagnosis not present

## 2017-08-18 DIAGNOSIS — C50912 Malignant neoplasm of unspecified site of left female breast: Secondary | ICD-10-CM | POA: Diagnosis not present

## 2017-08-18 DIAGNOSIS — C773 Secondary and unspecified malignant neoplasm of axilla and upper limb lymph nodes: Secondary | ICD-10-CM | POA: Diagnosis not present

## 2017-08-19 DIAGNOSIS — Z170 Estrogen receptor positive status [ER+]: Secondary | ICD-10-CM | POA: Diagnosis not present

## 2017-08-19 DIAGNOSIS — C50812 Malignant neoplasm of overlapping sites of left female breast: Secondary | ICD-10-CM | POA: Diagnosis not present

## 2017-08-24 DIAGNOSIS — C50912 Malignant neoplasm of unspecified site of left female breast: Secondary | ICD-10-CM | POA: Diagnosis not present

## 2017-08-24 DIAGNOSIS — C50412 Malignant neoplasm of upper-outer quadrant of left female breast: Secondary | ICD-10-CM | POA: Insufficient documentation

## 2017-08-25 DIAGNOSIS — Z4001 Encounter for prophylactic removal of breast: Secondary | ICD-10-CM | POA: Diagnosis not present

## 2017-08-25 DIAGNOSIS — D0502 Lobular carcinoma in situ of left breast: Secondary | ICD-10-CM | POA: Diagnosis not present

## 2017-08-25 DIAGNOSIS — G8918 Other acute postprocedural pain: Secondary | ICD-10-CM | POA: Diagnosis not present

## 2017-08-25 DIAGNOSIS — C50612 Malignant neoplasm of axillary tail of left female breast: Secondary | ICD-10-CM | POA: Diagnosis not present

## 2017-08-25 DIAGNOSIS — Z9221 Personal history of antineoplastic chemotherapy: Secondary | ICD-10-CM | POA: Diagnosis not present

## 2017-08-25 DIAGNOSIS — C50412 Malignant neoplasm of upper-outer quadrant of left female breast: Secondary | ICD-10-CM | POA: Diagnosis not present

## 2017-08-25 DIAGNOSIS — C773 Secondary and unspecified malignant neoplasm of axilla and upper limb lymph nodes: Secondary | ICD-10-CM | POA: Diagnosis not present

## 2017-08-25 DIAGNOSIS — Z170 Estrogen receptor positive status [ER+]: Secondary | ICD-10-CM | POA: Diagnosis not present

## 2017-08-26 DIAGNOSIS — C50412 Malignant neoplasm of upper-outer quadrant of left female breast: Secondary | ICD-10-CM | POA: Diagnosis not present

## 2017-08-26 DIAGNOSIS — Z9221 Personal history of antineoplastic chemotherapy: Secondary | ICD-10-CM | POA: Diagnosis not present

## 2017-08-26 DIAGNOSIS — C773 Secondary and unspecified malignant neoplasm of axilla and upper limb lymph nodes: Secondary | ICD-10-CM | POA: Diagnosis not present

## 2017-08-26 DIAGNOSIS — Z170 Estrogen receptor positive status [ER+]: Secondary | ICD-10-CM | POA: Diagnosis not present

## 2017-08-27 DIAGNOSIS — C773 Secondary and unspecified malignant neoplasm of axilla and upper limb lymph nodes: Secondary | ICD-10-CM | POA: Diagnosis not present

## 2017-08-27 DIAGNOSIS — Z9011 Acquired absence of right breast and nipple: Secondary | ICD-10-CM | POA: Diagnosis not present

## 2017-08-27 DIAGNOSIS — C50412 Malignant neoplasm of upper-outer quadrant of left female breast: Secondary | ICD-10-CM | POA: Diagnosis not present

## 2017-08-27 DIAGNOSIS — Z9221 Personal history of antineoplastic chemotherapy: Secondary | ICD-10-CM | POA: Diagnosis not present

## 2017-08-27 DIAGNOSIS — C50912 Malignant neoplasm of unspecified site of left female breast: Secondary | ICD-10-CM | POA: Diagnosis not present

## 2017-08-27 DIAGNOSIS — Z170 Estrogen receptor positive status [ER+]: Secondary | ICD-10-CM | POA: Diagnosis not present

## 2017-08-31 DIAGNOSIS — C773 Secondary and unspecified malignant neoplasm of axilla and upper limb lymph nodes: Secondary | ICD-10-CM | POA: Diagnosis not present

## 2017-08-31 DIAGNOSIS — C50512 Malignant neoplasm of lower-outer quadrant of left female breast: Secondary | ICD-10-CM | POA: Diagnosis not present

## 2017-08-31 DIAGNOSIS — C50412 Malignant neoplasm of upper-outer quadrant of left female breast: Secondary | ICD-10-CM | POA: Diagnosis not present

## 2017-08-31 DIAGNOSIS — Z170 Estrogen receptor positive status [ER+]: Secondary | ICD-10-CM | POA: Diagnosis not present

## 2017-09-04 HISTORY — PX: BILATERAL TOTAL MASTECTOMY WITH AXILLARY LYMPH NODE DISSECTION: SHX6364

## 2017-09-13 DIAGNOSIS — C773 Secondary and unspecified malignant neoplasm of axilla and upper limb lymph nodes: Secondary | ICD-10-CM | POA: Diagnosis not present

## 2017-09-13 DIAGNOSIS — C50912 Malignant neoplasm of unspecified site of left female breast: Secondary | ICD-10-CM | POA: Diagnosis not present

## 2017-09-19 DIAGNOSIS — C50919 Malignant neoplasm of unspecified site of unspecified female breast: Secondary | ICD-10-CM | POA: Diagnosis not present

## 2017-09-19 DIAGNOSIS — I700 Atherosclerosis of aorta: Secondary | ICD-10-CM | POA: Diagnosis not present

## 2017-09-19 DIAGNOSIS — C50912 Malignant neoplasm of unspecified site of left female breast: Secondary | ICD-10-CM | POA: Diagnosis not present

## 2017-09-19 DIAGNOSIS — C773 Secondary and unspecified malignant neoplasm of axilla and upper limb lymph nodes: Secondary | ICD-10-CM | POA: Diagnosis not present

## 2017-09-19 DIAGNOSIS — J439 Emphysema, unspecified: Secondary | ICD-10-CM | POA: Diagnosis not present

## 2017-09-22 DIAGNOSIS — Z9013 Acquired absence of bilateral breasts and nipples: Secondary | ICD-10-CM | POA: Insufficient documentation

## 2017-09-22 DIAGNOSIS — C50412 Malignant neoplasm of upper-outer quadrant of left female breast: Secondary | ICD-10-CM | POA: Diagnosis not present

## 2017-09-22 DIAGNOSIS — C50912 Malignant neoplasm of unspecified site of left female breast: Secondary | ICD-10-CM | POA: Diagnosis not present

## 2017-09-22 DIAGNOSIS — C773 Secondary and unspecified malignant neoplasm of axilla and upper limb lymph nodes: Secondary | ICD-10-CM | POA: Diagnosis not present

## 2017-10-19 DIAGNOSIS — Z170 Estrogen receptor positive status [ER+]: Secondary | ICD-10-CM | POA: Diagnosis not present

## 2017-10-19 DIAGNOSIS — C773 Secondary and unspecified malignant neoplasm of axilla and upper limb lymph nodes: Secondary | ICD-10-CM | POA: Diagnosis not present

## 2017-10-19 DIAGNOSIS — Z9013 Acquired absence of bilateral breasts and nipples: Secondary | ICD-10-CM | POA: Diagnosis not present

## 2017-10-19 DIAGNOSIS — F39 Unspecified mood [affective] disorder: Secondary | ICD-10-CM | POA: Diagnosis not present

## 2017-10-19 DIAGNOSIS — C50512 Malignant neoplasm of lower-outer quadrant of left female breast: Secondary | ICD-10-CM | POA: Diagnosis not present

## 2017-10-19 DIAGNOSIS — C50412 Malignant neoplasm of upper-outer quadrant of left female breast: Secondary | ICD-10-CM | POA: Diagnosis not present

## 2017-10-19 DIAGNOSIS — C50912 Malignant neoplasm of unspecified site of left female breast: Secondary | ICD-10-CM | POA: Diagnosis not present

## 2017-10-21 MED FILL — ANASTROZOLE 1 MG TABLET: 1 | 30 days supply | Qty: 30 | Fill #0

## 2017-11-09 DIAGNOSIS — C50912 Malignant neoplasm of unspecified site of left female breast: Secondary | ICD-10-CM | POA: Diagnosis not present

## 2017-11-09 DIAGNOSIS — C773 Secondary and unspecified malignant neoplasm of axilla and upper limb lymph nodes: Secondary | ICD-10-CM | POA: Diagnosis not present

## 2017-11-09 DIAGNOSIS — Z9013 Acquired absence of bilateral breasts and nipples: Secondary | ICD-10-CM | POA: Diagnosis not present

## 2017-11-17 DIAGNOSIS — C773 Secondary and unspecified malignant neoplasm of axilla and upper limb lymph nodes: Secondary | ICD-10-CM | POA: Diagnosis not present

## 2017-11-17 DIAGNOSIS — C50912 Malignant neoplasm of unspecified site of left female breast: Secondary | ICD-10-CM | POA: Diagnosis not present

## 2017-11-17 DIAGNOSIS — Z510 Encounter for antineoplastic radiation therapy: Secondary | ICD-10-CM | POA: Diagnosis not present

## 2017-11-17 MED FILL — ANASTROZOLE 1 MG TABLET: 1 | 30 days supply | Qty: 30 | Fill #1

## 2017-11-21 DIAGNOSIS — C50912 Malignant neoplasm of unspecified site of left female breast: Secondary | ICD-10-CM | POA: Diagnosis not present

## 2017-11-24 DIAGNOSIS — C773 Secondary and unspecified malignant neoplasm of axilla and upper limb lymph nodes: Secondary | ICD-10-CM | POA: Diagnosis not present

## 2017-11-24 DIAGNOSIS — Z510 Encounter for antineoplastic radiation therapy: Secondary | ICD-10-CM | POA: Diagnosis not present

## 2017-11-24 DIAGNOSIS — C50912 Malignant neoplasm of unspecified site of left female breast: Secondary | ICD-10-CM | POA: Diagnosis not present

## 2017-11-28 DIAGNOSIS — Z510 Encounter for antineoplastic radiation therapy: Secondary | ICD-10-CM | POA: Diagnosis not present

## 2017-11-28 DIAGNOSIS — C50912 Malignant neoplasm of unspecified site of left female breast: Secondary | ICD-10-CM | POA: Diagnosis not present

## 2017-11-28 DIAGNOSIS — C773 Secondary and unspecified malignant neoplasm of axilla and upper limb lymph nodes: Secondary | ICD-10-CM | POA: Diagnosis not present

## 2017-11-30 DIAGNOSIS — Z9013 Acquired absence of bilateral breasts and nipples: Secondary | ICD-10-CM | POA: Diagnosis not present

## 2017-11-30 DIAGNOSIS — C773 Secondary and unspecified malignant neoplasm of axilla and upper limb lymph nodes: Secondary | ICD-10-CM | POA: Diagnosis not present

## 2017-11-30 DIAGNOSIS — C50912 Malignant neoplasm of unspecified site of left female breast: Secondary | ICD-10-CM | POA: Diagnosis not present

## 2017-11-30 DIAGNOSIS — Z510 Encounter for antineoplastic radiation therapy: Secondary | ICD-10-CM | POA: Diagnosis not present

## 2017-11-30 DIAGNOSIS — C50412 Malignant neoplasm of upper-outer quadrant of left female breast: Secondary | ICD-10-CM | POA: Diagnosis not present

## 2017-12-01 DIAGNOSIS — C773 Secondary and unspecified malignant neoplasm of axilla and upper limb lymph nodes: Secondary | ICD-10-CM | POA: Diagnosis not present

## 2017-12-01 DIAGNOSIS — Z510 Encounter for antineoplastic radiation therapy: Secondary | ICD-10-CM | POA: Diagnosis not present

## 2017-12-01 DIAGNOSIS — C50912 Malignant neoplasm of unspecified site of left female breast: Secondary | ICD-10-CM | POA: Diagnosis not present

## 2017-12-02 DIAGNOSIS — C50912 Malignant neoplasm of unspecified site of left female breast: Secondary | ICD-10-CM | POA: Diagnosis not present

## 2017-12-02 DIAGNOSIS — C773 Secondary and unspecified malignant neoplasm of axilla and upper limb lymph nodes: Secondary | ICD-10-CM | POA: Diagnosis not present

## 2017-12-02 DIAGNOSIS — Z510 Encounter for antineoplastic radiation therapy: Secondary | ICD-10-CM | POA: Diagnosis not present

## 2017-12-05 DIAGNOSIS — Z510 Encounter for antineoplastic radiation therapy: Secondary | ICD-10-CM | POA: Diagnosis not present

## 2017-12-05 DIAGNOSIS — C50912 Malignant neoplasm of unspecified site of left female breast: Secondary | ICD-10-CM | POA: Diagnosis not present

## 2017-12-05 DIAGNOSIS — C773 Secondary and unspecified malignant neoplasm of axilla and upper limb lymph nodes: Secondary | ICD-10-CM | POA: Diagnosis not present

## 2017-12-07 DIAGNOSIS — C50912 Malignant neoplasm of unspecified site of left female breast: Secondary | ICD-10-CM | POA: Diagnosis not present

## 2017-12-07 DIAGNOSIS — Z510 Encounter for antineoplastic radiation therapy: Secondary | ICD-10-CM | POA: Diagnosis not present

## 2017-12-07 DIAGNOSIS — C773 Secondary and unspecified malignant neoplasm of axilla and upper limb lymph nodes: Secondary | ICD-10-CM | POA: Diagnosis not present

## 2017-12-08 DIAGNOSIS — C50912 Malignant neoplasm of unspecified site of left female breast: Secondary | ICD-10-CM | POA: Diagnosis not present

## 2017-12-08 DIAGNOSIS — Z510 Encounter for antineoplastic radiation therapy: Secondary | ICD-10-CM | POA: Diagnosis not present

## 2017-12-08 DIAGNOSIS — C773 Secondary and unspecified malignant neoplasm of axilla and upper limb lymph nodes: Secondary | ICD-10-CM | POA: Diagnosis not present

## 2017-12-09 DIAGNOSIS — C773 Secondary and unspecified malignant neoplasm of axilla and upper limb lymph nodes: Secondary | ICD-10-CM | POA: Diagnosis not present

## 2017-12-09 DIAGNOSIS — Z510 Encounter for antineoplastic radiation therapy: Secondary | ICD-10-CM | POA: Diagnosis not present

## 2017-12-09 DIAGNOSIS — C50912 Malignant neoplasm of unspecified site of left female breast: Secondary | ICD-10-CM | POA: Diagnosis not present

## 2017-12-12 DIAGNOSIS — C773 Secondary and unspecified malignant neoplasm of axilla and upper limb lymph nodes: Secondary | ICD-10-CM | POA: Diagnosis not present

## 2017-12-12 DIAGNOSIS — Z510 Encounter for antineoplastic radiation therapy: Secondary | ICD-10-CM | POA: Diagnosis not present

## 2017-12-12 DIAGNOSIS — C50912 Malignant neoplasm of unspecified site of left female breast: Secondary | ICD-10-CM | POA: Diagnosis not present

## 2017-12-13 DIAGNOSIS — Z510 Encounter for antineoplastic radiation therapy: Secondary | ICD-10-CM | POA: Diagnosis not present

## 2017-12-13 DIAGNOSIS — C773 Secondary and unspecified malignant neoplasm of axilla and upper limb lymph nodes: Secondary | ICD-10-CM | POA: Diagnosis not present

## 2017-12-13 DIAGNOSIS — C50912 Malignant neoplasm of unspecified site of left female breast: Secondary | ICD-10-CM | POA: Diagnosis not present

## 2017-12-13 MED FILL — ANASTROZOLE 1 MG TABLET: 1 | 90 days supply | Qty: 90 | Fill #0

## 2017-12-14 DIAGNOSIS — C773 Secondary and unspecified malignant neoplasm of axilla and upper limb lymph nodes: Secondary | ICD-10-CM | POA: Diagnosis not present

## 2017-12-14 DIAGNOSIS — C50912 Malignant neoplasm of unspecified site of left female breast: Secondary | ICD-10-CM | POA: Diagnosis not present

## 2017-12-14 DIAGNOSIS — Z510 Encounter for antineoplastic radiation therapy: Secondary | ICD-10-CM | POA: Diagnosis not present

## 2017-12-15 DIAGNOSIS — C50912 Malignant neoplasm of unspecified site of left female breast: Secondary | ICD-10-CM | POA: Diagnosis not present

## 2017-12-15 DIAGNOSIS — Z510 Encounter for antineoplastic radiation therapy: Secondary | ICD-10-CM | POA: Diagnosis not present

## 2017-12-15 DIAGNOSIS — C773 Secondary and unspecified malignant neoplasm of axilla and upper limb lymph nodes: Secondary | ICD-10-CM | POA: Diagnosis not present

## 2017-12-16 DIAGNOSIS — Z510 Encounter for antineoplastic radiation therapy: Secondary | ICD-10-CM | POA: Diagnosis not present

## 2017-12-16 DIAGNOSIS — C773 Secondary and unspecified malignant neoplasm of axilla and upper limb lymph nodes: Secondary | ICD-10-CM | POA: Diagnosis not present

## 2017-12-16 DIAGNOSIS — C50912 Malignant neoplasm of unspecified site of left female breast: Secondary | ICD-10-CM | POA: Diagnosis not present

## 2017-12-19 DIAGNOSIS — Z79811 Long term (current) use of aromatase inhibitors: Secondary | ICD-10-CM | POA: Diagnosis not present

## 2017-12-19 DIAGNOSIS — C50912 Malignant neoplasm of unspecified site of left female breast: Secondary | ICD-10-CM | POA: Diagnosis not present

## 2017-12-19 DIAGNOSIS — Z9013 Acquired absence of bilateral breasts and nipples: Secondary | ICD-10-CM | POA: Diagnosis not present

## 2017-12-19 DIAGNOSIS — C50412 Malignant neoplasm of upper-outer quadrant of left female breast: Secondary | ICD-10-CM | POA: Diagnosis not present

## 2017-12-19 DIAGNOSIS — Z510 Encounter for antineoplastic radiation therapy: Secondary | ICD-10-CM | POA: Diagnosis not present

## 2017-12-19 DIAGNOSIS — C773 Secondary and unspecified malignant neoplasm of axilla and upper limb lymph nodes: Secondary | ICD-10-CM | POA: Diagnosis not present

## 2017-12-20 DIAGNOSIS — C50912 Malignant neoplasm of unspecified site of left female breast: Secondary | ICD-10-CM | POA: Diagnosis not present

## 2017-12-20 DIAGNOSIS — Z510 Encounter for antineoplastic radiation therapy: Secondary | ICD-10-CM | POA: Diagnosis not present

## 2017-12-20 DIAGNOSIS — C773 Secondary and unspecified malignant neoplasm of axilla and upper limb lymph nodes: Secondary | ICD-10-CM | POA: Diagnosis not present

## 2017-12-21 DIAGNOSIS — C50912 Malignant neoplasm of unspecified site of left female breast: Secondary | ICD-10-CM | POA: Diagnosis not present

## 2017-12-21 DIAGNOSIS — C773 Secondary and unspecified malignant neoplasm of axilla and upper limb lymph nodes: Secondary | ICD-10-CM | POA: Diagnosis not present

## 2017-12-21 DIAGNOSIS — Z510 Encounter for antineoplastic radiation therapy: Secondary | ICD-10-CM | POA: Diagnosis not present

## 2017-12-22 DIAGNOSIS — C773 Secondary and unspecified malignant neoplasm of axilla and upper limb lymph nodes: Secondary | ICD-10-CM | POA: Diagnosis not present

## 2017-12-22 DIAGNOSIS — C50912 Malignant neoplasm of unspecified site of left female breast: Secondary | ICD-10-CM | POA: Diagnosis not present

## 2017-12-22 DIAGNOSIS — Z510 Encounter for antineoplastic radiation therapy: Secondary | ICD-10-CM | POA: Diagnosis not present

## 2017-12-23 DIAGNOSIS — C773 Secondary and unspecified malignant neoplasm of axilla and upper limb lymph nodes: Secondary | ICD-10-CM | POA: Diagnosis not present

## 2017-12-23 DIAGNOSIS — C50912 Malignant neoplasm of unspecified site of left female breast: Secondary | ICD-10-CM | POA: Diagnosis not present

## 2017-12-23 DIAGNOSIS — Z510 Encounter for antineoplastic radiation therapy: Secondary | ICD-10-CM | POA: Diagnosis not present

## 2017-12-27 DIAGNOSIS — C773 Secondary and unspecified malignant neoplasm of axilla and upper limb lymph nodes: Secondary | ICD-10-CM | POA: Diagnosis not present

## 2017-12-27 DIAGNOSIS — C50912 Malignant neoplasm of unspecified site of left female breast: Secondary | ICD-10-CM | POA: Diagnosis not present

## 2017-12-27 DIAGNOSIS — Z510 Encounter for antineoplastic radiation therapy: Secondary | ICD-10-CM | POA: Diagnosis not present

## 2017-12-28 DIAGNOSIS — Z510 Encounter for antineoplastic radiation therapy: Secondary | ICD-10-CM | POA: Diagnosis not present

## 2017-12-28 DIAGNOSIS — C50912 Malignant neoplasm of unspecified site of left female breast: Secondary | ICD-10-CM | POA: Diagnosis not present

## 2017-12-28 DIAGNOSIS — C773 Secondary and unspecified malignant neoplasm of axilla and upper limb lymph nodes: Secondary | ICD-10-CM | POA: Diagnosis not present

## 2017-12-29 DIAGNOSIS — C773 Secondary and unspecified malignant neoplasm of axilla and upper limb lymph nodes: Secondary | ICD-10-CM | POA: Diagnosis not present

## 2017-12-29 DIAGNOSIS — Z510 Encounter for antineoplastic radiation therapy: Secondary | ICD-10-CM | POA: Diagnosis not present

## 2017-12-29 DIAGNOSIS — C50912 Malignant neoplasm of unspecified site of left female breast: Secondary | ICD-10-CM | POA: Diagnosis not present

## 2017-12-30 DIAGNOSIS — C50912 Malignant neoplasm of unspecified site of left female breast: Secondary | ICD-10-CM | POA: Diagnosis not present

## 2017-12-30 DIAGNOSIS — C773 Secondary and unspecified malignant neoplasm of axilla and upper limb lymph nodes: Secondary | ICD-10-CM | POA: Diagnosis not present

## 2017-12-30 DIAGNOSIS — Z510 Encounter for antineoplastic radiation therapy: Secondary | ICD-10-CM | POA: Diagnosis not present

## 2018-01-02 DIAGNOSIS — C773 Secondary and unspecified malignant neoplasm of axilla and upper limb lymph nodes: Secondary | ICD-10-CM | POA: Diagnosis not present

## 2018-01-02 DIAGNOSIS — Z510 Encounter for antineoplastic radiation therapy: Secondary | ICD-10-CM | POA: Diagnosis not present

## 2018-01-02 DIAGNOSIS — C50912 Malignant neoplasm of unspecified site of left female breast: Secondary | ICD-10-CM | POA: Diagnosis not present

## 2018-01-03 DIAGNOSIS — Z510 Encounter for antineoplastic radiation therapy: Secondary | ICD-10-CM | POA: Diagnosis not present

## 2018-01-03 DIAGNOSIS — C50912 Malignant neoplasm of unspecified site of left female breast: Secondary | ICD-10-CM | POA: Diagnosis not present

## 2018-01-03 DIAGNOSIS — C773 Secondary and unspecified malignant neoplasm of axilla and upper limb lymph nodes: Secondary | ICD-10-CM | POA: Diagnosis not present

## 2018-01-04 DIAGNOSIS — C50912 Malignant neoplasm of unspecified site of left female breast: Secondary | ICD-10-CM | POA: Diagnosis not present

## 2018-01-04 DIAGNOSIS — Z9013 Acquired absence of bilateral breasts and nipples: Secondary | ICD-10-CM | POA: Diagnosis not present

## 2018-01-04 DIAGNOSIS — C773 Secondary and unspecified malignant neoplasm of axilla and upper limb lymph nodes: Secondary | ICD-10-CM | POA: Diagnosis not present

## 2018-01-04 DIAGNOSIS — Z510 Encounter for antineoplastic radiation therapy: Secondary | ICD-10-CM | POA: Diagnosis not present

## 2018-01-05 DIAGNOSIS — Z510 Encounter for antineoplastic radiation therapy: Secondary | ICD-10-CM | POA: Diagnosis not present

## 2018-01-05 DIAGNOSIS — C773 Secondary and unspecified malignant neoplasm of axilla and upper limb lymph nodes: Secondary | ICD-10-CM | POA: Diagnosis not present

## 2018-01-05 DIAGNOSIS — C50912 Malignant neoplasm of unspecified site of left female breast: Secondary | ICD-10-CM | POA: Diagnosis not present

## 2018-01-06 DIAGNOSIS — C50912 Malignant neoplasm of unspecified site of left female breast: Secondary | ICD-10-CM | POA: Diagnosis not present

## 2018-01-06 DIAGNOSIS — Z510 Encounter for antineoplastic radiation therapy: Secondary | ICD-10-CM | POA: Diagnosis not present

## 2018-01-06 DIAGNOSIS — C773 Secondary and unspecified malignant neoplasm of axilla and upper limb lymph nodes: Secondary | ICD-10-CM | POA: Diagnosis not present

## 2018-01-09 DIAGNOSIS — C773 Secondary and unspecified malignant neoplasm of axilla and upper limb lymph nodes: Secondary | ICD-10-CM | POA: Diagnosis not present

## 2018-01-09 DIAGNOSIS — C50912 Malignant neoplasm of unspecified site of left female breast: Secondary | ICD-10-CM | POA: Diagnosis not present

## 2018-01-09 DIAGNOSIS — Z510 Encounter for antineoplastic radiation therapy: Secondary | ICD-10-CM | POA: Diagnosis not present

## 2018-01-10 DIAGNOSIS — C773 Secondary and unspecified malignant neoplasm of axilla and upper limb lymph nodes: Secondary | ICD-10-CM | POA: Diagnosis not present

## 2018-01-10 DIAGNOSIS — C50912 Malignant neoplasm of unspecified site of left female breast: Secondary | ICD-10-CM | POA: Diagnosis not present

## 2018-01-10 DIAGNOSIS — Z510 Encounter for antineoplastic radiation therapy: Secondary | ICD-10-CM | POA: Diagnosis not present

## 2018-01-11 DIAGNOSIS — C50912 Malignant neoplasm of unspecified site of left female breast: Secondary | ICD-10-CM | POA: Diagnosis not present

## 2018-01-11 DIAGNOSIS — C773 Secondary and unspecified malignant neoplasm of axilla and upper limb lymph nodes: Secondary | ICD-10-CM | POA: Diagnosis not present

## 2018-01-11 DIAGNOSIS — Z9013 Acquired absence of bilateral breasts and nipples: Secondary | ICD-10-CM | POA: Diagnosis not present

## 2018-01-12 DIAGNOSIS — C50412 Malignant neoplasm of upper-outer quadrant of left female breast: Secondary | ICD-10-CM | POA: Diagnosis not present

## 2018-01-12 DIAGNOSIS — C773 Secondary and unspecified malignant neoplasm of axilla and upper limb lymph nodes: Secondary | ICD-10-CM | POA: Diagnosis not present

## 2018-01-12 DIAGNOSIS — Z9013 Acquired absence of bilateral breasts and nipples: Secondary | ICD-10-CM | POA: Diagnosis not present

## 2018-01-12 DIAGNOSIS — C50912 Malignant neoplasm of unspecified site of left female breast: Secondary | ICD-10-CM | POA: Diagnosis not present

## 2018-01-12 DIAGNOSIS — Z79811 Long term (current) use of aromatase inhibitors: Secondary | ICD-10-CM | POA: Diagnosis not present

## 2018-01-24 DIAGNOSIS — Z9221 Personal history of antineoplastic chemotherapy: Secondary | ICD-10-CM | POA: Diagnosis not present

## 2018-01-24 DIAGNOSIS — Z9013 Acquired absence of bilateral breasts and nipples: Secondary | ICD-10-CM | POA: Diagnosis not present

## 2018-01-24 DIAGNOSIS — C773 Secondary and unspecified malignant neoplasm of axilla and upper limb lymph nodes: Secondary | ICD-10-CM | POA: Diagnosis not present

## 2018-01-24 DIAGNOSIS — C50912 Malignant neoplasm of unspecified site of left female breast: Secondary | ICD-10-CM | POA: Diagnosis not present

## 2018-02-03 DIAGNOSIS — C50912 Malignant neoplasm of unspecified site of left female breast: Secondary | ICD-10-CM | POA: Diagnosis not present

## 2018-02-03 DIAGNOSIS — Z79811 Long term (current) use of aromatase inhibitors: Secondary | ICD-10-CM | POA: Diagnosis not present

## 2018-02-03 DIAGNOSIS — C50412 Malignant neoplasm of upper-outer quadrant of left female breast: Secondary | ICD-10-CM | POA: Diagnosis not present

## 2018-02-03 DIAGNOSIS — Z9013 Acquired absence of bilateral breasts and nipples: Secondary | ICD-10-CM | POA: Diagnosis not present

## 2018-02-03 DIAGNOSIS — C773 Secondary and unspecified malignant neoplasm of axilla and upper limb lymph nodes: Secondary | ICD-10-CM | POA: Diagnosis not present

## 2018-02-03 DIAGNOSIS — Z9221 Personal history of antineoplastic chemotherapy: Secondary | ICD-10-CM | POA: Diagnosis not present

## 2018-02-08 DIAGNOSIS — C773 Secondary and unspecified malignant neoplasm of axilla and upper limb lymph nodes: Secondary | ICD-10-CM | POA: Diagnosis not present

## 2018-02-08 DIAGNOSIS — C50912 Malignant neoplasm of unspecified site of left female breast: Secondary | ICD-10-CM | POA: Diagnosis not present

## 2018-02-08 DIAGNOSIS — Z9013 Acquired absence of bilateral breasts and nipples: Secondary | ICD-10-CM | POA: Diagnosis not present

## 2018-02-08 DIAGNOSIS — C50812 Malignant neoplasm of overlapping sites of left female breast: Secondary | ICD-10-CM | POA: Diagnosis not present

## 2018-02-08 DIAGNOSIS — L640 Drug-induced androgenic alopecia: Secondary | ICD-10-CM | POA: Diagnosis not present

## 2018-02-15 DIAGNOSIS — Z9013 Acquired absence of bilateral breasts and nipples: Secondary | ICD-10-CM | POA: Diagnosis not present

## 2018-02-15 DIAGNOSIS — C50912 Malignant neoplasm of unspecified site of left female breast: Secondary | ICD-10-CM | POA: Diagnosis not present

## 2018-02-15 DIAGNOSIS — C773 Secondary and unspecified malignant neoplasm of axilla and upper limb lymph nodes: Secondary | ICD-10-CM | POA: Diagnosis not present

## 2018-02-21 DIAGNOSIS — C773 Secondary and unspecified malignant neoplasm of axilla and upper limb lymph nodes: Secondary | ICD-10-CM | POA: Diagnosis not present

## 2018-02-21 DIAGNOSIS — C50912 Malignant neoplasm of unspecified site of left female breast: Secondary | ICD-10-CM | POA: Diagnosis not present

## 2018-02-21 DIAGNOSIS — Z9013 Acquired absence of bilateral breasts and nipples: Secondary | ICD-10-CM | POA: Diagnosis not present

## 2018-02-28 DIAGNOSIS — C50912 Malignant neoplasm of unspecified site of left female breast: Secondary | ICD-10-CM | POA: Diagnosis not present

## 2018-02-28 DIAGNOSIS — C773 Secondary and unspecified malignant neoplasm of axilla and upper limb lymph nodes: Secondary | ICD-10-CM | POA: Diagnosis not present

## 2018-02-28 DIAGNOSIS — Z9013 Acquired absence of bilateral breasts and nipples: Secondary | ICD-10-CM | POA: Diagnosis not present

## 2018-03-07 DIAGNOSIS — C50912 Malignant neoplasm of unspecified site of left female breast: Secondary | ICD-10-CM | POA: Diagnosis not present

## 2018-03-07 DIAGNOSIS — C773 Secondary and unspecified malignant neoplasm of axilla and upper limb lymph nodes: Secondary | ICD-10-CM | POA: Diagnosis not present

## 2018-03-07 DIAGNOSIS — Z9013 Acquired absence of bilateral breasts and nipples: Secondary | ICD-10-CM | POA: Diagnosis not present

## 2018-03-14 DIAGNOSIS — Z7983 Long term (current) use of bisphosphonates: Secondary | ICD-10-CM | POA: Diagnosis not present

## 2018-03-14 DIAGNOSIS — Z5181 Encounter for therapeutic drug level monitoring: Secondary | ICD-10-CM | POA: Diagnosis not present

## 2018-03-14 DIAGNOSIS — C773 Secondary and unspecified malignant neoplasm of axilla and upper limb lymph nodes: Secondary | ICD-10-CM | POA: Diagnosis not present

## 2018-03-14 DIAGNOSIS — C50412 Malignant neoplasm of upper-outer quadrant of left female breast: Secondary | ICD-10-CM | POA: Diagnosis not present

## 2018-03-14 DIAGNOSIS — Z170 Estrogen receptor positive status [ER+]: Secondary | ICD-10-CM | POA: Diagnosis not present

## 2018-03-14 DIAGNOSIS — C50912 Malignant neoplasm of unspecified site of left female breast: Secondary | ICD-10-CM | POA: Diagnosis not present

## 2018-03-20 MED FILL — ANASTROZOLE 1 MG TABLET: 1 | 90 days supply | Qty: 90 | Fill #1

## 2018-03-21 DIAGNOSIS — C50912 Malignant neoplasm of unspecified site of left female breast: Secondary | ICD-10-CM | POA: Diagnosis not present

## 2018-03-21 DIAGNOSIS — C773 Secondary and unspecified malignant neoplasm of axilla and upper limb lymph nodes: Secondary | ICD-10-CM | POA: Diagnosis not present

## 2018-03-21 DIAGNOSIS — Z9221 Personal history of antineoplastic chemotherapy: Secondary | ICD-10-CM | POA: Diagnosis not present

## 2018-03-21 DIAGNOSIS — Z9013 Acquired absence of bilateral breasts and nipples: Secondary | ICD-10-CM | POA: Diagnosis not present

## 2018-03-28 DIAGNOSIS — Z01419 Encounter for gynecological examination (general) (routine) without abnormal findings: Secondary | ICD-10-CM | POA: Diagnosis not present

## 2018-03-29 DIAGNOSIS — C773 Secondary and unspecified malignant neoplasm of axilla and upper limb lymph nodes: Secondary | ICD-10-CM | POA: Diagnosis not present

## 2018-03-29 DIAGNOSIS — Z9013 Acquired absence of bilateral breasts and nipples: Secondary | ICD-10-CM | POA: Diagnosis not present

## 2018-03-29 DIAGNOSIS — C50912 Malignant neoplasm of unspecified site of left female breast: Secondary | ICD-10-CM | POA: Diagnosis not present

## 2018-03-29 DIAGNOSIS — F320 Major depressive disorder, single episode, mild: Secondary | ICD-10-CM | POA: Diagnosis not present

## 2018-04-05 DIAGNOSIS — F320 Major depressive disorder, single episode, mild: Secondary | ICD-10-CM | POA: Diagnosis not present

## 2018-04-12 DIAGNOSIS — F320 Major depressive disorder, single episode, mild: Secondary | ICD-10-CM | POA: Diagnosis not present

## 2018-04-14 DIAGNOSIS — C773 Secondary and unspecified malignant neoplasm of axilla and upper limb lymph nodes: Secondary | ICD-10-CM | POA: Diagnosis not present

## 2018-04-14 DIAGNOSIS — J069 Acute upper respiratory infection, unspecified: Secondary | ICD-10-CM | POA: Diagnosis not present

## 2018-04-14 DIAGNOSIS — C50912 Malignant neoplasm of unspecified site of left female breast: Secondary | ICD-10-CM | POA: Diagnosis not present

## 2018-04-14 DIAGNOSIS — A0472 Enterocolitis due to Clostridium difficile, not specified as recurrent: Secondary | ICD-10-CM | POA: Diagnosis not present

## 2018-04-19 DIAGNOSIS — F320 Major depressive disorder, single episode, mild: Secondary | ICD-10-CM | POA: Diagnosis not present

## 2018-04-26 DIAGNOSIS — F320 Major depressive disorder, single episode, mild: Secondary | ICD-10-CM | POA: Diagnosis not present

## 2018-05-03 DIAGNOSIS — F320 Major depressive disorder, single episode, mild: Secondary | ICD-10-CM | POA: Diagnosis not present

## 2018-05-05 DIAGNOSIS — M79622 Pain in left upper arm: Secondary | ICD-10-CM | POA: Diagnosis not present

## 2018-05-05 DIAGNOSIS — Z170 Estrogen receptor positive status [ER+]: Secondary | ICD-10-CM | POA: Diagnosis not present

## 2018-05-05 DIAGNOSIS — C50412 Malignant neoplasm of upper-outer quadrant of left female breast: Secondary | ICD-10-CM | POA: Diagnosis not present

## 2018-05-05 DIAGNOSIS — M25612 Stiffness of left shoulder, not elsewhere classified: Secondary | ICD-10-CM | POA: Diagnosis not present

## 2018-05-10 DIAGNOSIS — Z9013 Acquired absence of bilateral breasts and nipples: Secondary | ICD-10-CM | POA: Diagnosis not present

## 2018-05-10 DIAGNOSIS — Z853 Personal history of malignant neoplasm of breast: Secondary | ICD-10-CM | POA: Diagnosis not present

## 2018-05-10 DIAGNOSIS — Z79811 Long term (current) use of aromatase inhibitors: Secondary | ICD-10-CM | POA: Diagnosis not present

## 2018-05-10 DIAGNOSIS — F320 Major depressive disorder, single episode, mild: Secondary | ICD-10-CM | POA: Diagnosis not present

## 2018-05-17 DIAGNOSIS — F320 Major depressive disorder, single episode, mild: Secondary | ICD-10-CM | POA: Diagnosis not present

## 2018-05-18 DIAGNOSIS — I890 Lymphedema, not elsewhere classified: Secondary | ICD-10-CM | POA: Insufficient documentation

## 2018-05-24 DIAGNOSIS — F320 Major depressive disorder, single episode, mild: Secondary | ICD-10-CM | POA: Diagnosis not present

## 2018-05-31 DIAGNOSIS — F320 Major depressive disorder, single episode, mild: Secondary | ICD-10-CM | POA: Diagnosis not present

## 2018-06-09 MED FILL — ANASTROZOLE 1 MG TABLET: 1 | 90 days supply | Qty: 90 | Fill #2

## 2018-06-10 DIAGNOSIS — F320 Major depressive disorder, single episode, mild: Secondary | ICD-10-CM | POA: Diagnosis not present

## 2018-06-21 DIAGNOSIS — F320 Major depressive disorder, single episode, mild: Secondary | ICD-10-CM | POA: Diagnosis not present

## 2018-06-28 DIAGNOSIS — F320 Major depressive disorder, single episode, mild: Secondary | ICD-10-CM | POA: Diagnosis not present

## 2018-06-30 DIAGNOSIS — Z452 Encounter for adjustment and management of vascular access device: Secondary | ICD-10-CM | POA: Diagnosis not present

## 2018-07-05 DIAGNOSIS — F320 Major depressive disorder, single episode, mild: Secondary | ICD-10-CM | POA: Diagnosis not present

## 2018-07-25 DIAGNOSIS — Z9013 Acquired absence of bilateral breasts and nipples: Secondary | ICD-10-CM | POA: Diagnosis not present

## 2018-07-25 DIAGNOSIS — Z9221 Personal history of antineoplastic chemotherapy: Secondary | ICD-10-CM | POA: Diagnosis not present

## 2018-07-25 DIAGNOSIS — Z853 Personal history of malignant neoplasm of breast: Secondary | ICD-10-CM | POA: Diagnosis not present

## 2018-07-27 DIAGNOSIS — Z9013 Acquired absence of bilateral breasts and nipples: Secondary | ICD-10-CM | POA: Insufficient documentation

## 2018-07-27 DIAGNOSIS — Z853 Personal history of malignant neoplasm of breast: Secondary | ICD-10-CM | POA: Insufficient documentation

## 2018-08-03 DIAGNOSIS — C50412 Malignant neoplasm of upper-outer quadrant of left female breast: Secondary | ICD-10-CM | POA: Diagnosis not present

## 2018-08-03 DIAGNOSIS — R5383 Other fatigue: Secondary | ICD-10-CM | POA: Diagnosis not present

## 2018-08-03 DIAGNOSIS — C773 Secondary and unspecified malignant neoplasm of axilla and upper limb lymph nodes: Secondary | ICD-10-CM | POA: Diagnosis not present

## 2018-08-03 DIAGNOSIS — C50912 Malignant neoplasm of unspecified site of left female breast: Secondary | ICD-10-CM | POA: Diagnosis not present

## 2018-08-03 DIAGNOSIS — Z9013 Acquired absence of bilateral breasts and nipples: Secondary | ICD-10-CM | POA: Diagnosis not present

## 2018-08-03 DIAGNOSIS — Z79811 Long term (current) use of aromatase inhibitors: Secondary | ICD-10-CM | POA: Diagnosis not present

## 2018-08-03 DIAGNOSIS — Z170 Estrogen receptor positive status [ER+]: Secondary | ICD-10-CM | POA: Diagnosis not present

## 2018-08-03 DIAGNOSIS — R2232 Localized swelling, mass and lump, left upper limb: Secondary | ICD-10-CM | POA: Diagnosis not present

## 2018-08-03 DIAGNOSIS — F39 Unspecified mood [affective] disorder: Secondary | ICD-10-CM | POA: Diagnosis not present

## 2018-08-04 DIAGNOSIS — C50412 Malignant neoplasm of upper-outer quadrant of left female breast: Secondary | ICD-10-CM | POA: Diagnosis not present

## 2018-08-04 DIAGNOSIS — M79602 Pain in left arm: Secondary | ICD-10-CM | POA: Diagnosis not present

## 2018-08-04 DIAGNOSIS — C773 Secondary and unspecified malignant neoplasm of axilla and upper limb lymph nodes: Secondary | ICD-10-CM | POA: Diagnosis not present

## 2018-08-04 DIAGNOSIS — Z9013 Acquired absence of bilateral breasts and nipples: Secondary | ICD-10-CM | POA: Diagnosis not present

## 2018-08-04 DIAGNOSIS — C50912 Malignant neoplasm of unspecified site of left female breast: Secondary | ICD-10-CM | POA: Diagnosis not present

## 2018-08-04 DIAGNOSIS — R2232 Localized swelling, mass and lump, left upper limb: Secondary | ICD-10-CM | POA: Diagnosis not present

## 2018-08-04 DIAGNOSIS — Z170 Estrogen receptor positive status [ER+]: Secondary | ICD-10-CM | POA: Diagnosis not present

## 2018-08-08 DIAGNOSIS — Z9013 Acquired absence of bilateral breasts and nipples: Secondary | ICD-10-CM | POA: Diagnosis not present

## 2018-08-08 DIAGNOSIS — Z9221 Personal history of antineoplastic chemotherapy: Secondary | ICD-10-CM | POA: Diagnosis not present

## 2018-08-08 DIAGNOSIS — Z853 Personal history of malignant neoplasm of breast: Secondary | ICD-10-CM | POA: Diagnosis not present

## 2018-08-14 DIAGNOSIS — Z9882 Breast implant status: Secondary | ICD-10-CM | POA: Diagnosis not present

## 2018-08-14 DIAGNOSIS — G8918 Other acute postprocedural pain: Secondary | ICD-10-CM | POA: Diagnosis not present

## 2018-08-14 DIAGNOSIS — Z853 Personal history of malignant neoplasm of breast: Secondary | ICD-10-CM | POA: Diagnosis not present

## 2018-08-14 DIAGNOSIS — Z9013 Acquired absence of bilateral breasts and nipples: Secondary | ICD-10-CM | POA: Diagnosis not present

## 2018-08-14 DIAGNOSIS — Z923 Personal history of irradiation: Secondary | ICD-10-CM | POA: Diagnosis not present

## 2018-08-14 DIAGNOSIS — Z09 Encounter for follow-up examination after completed treatment for conditions other than malignant neoplasm: Secondary | ICD-10-CM | POA: Diagnosis not present

## 2018-08-24 DIAGNOSIS — C773 Secondary and unspecified malignant neoplasm of axilla and upper limb lymph nodes: Secondary | ICD-10-CM | POA: Diagnosis not present

## 2018-08-24 DIAGNOSIS — C50912 Malignant neoplasm of unspecified site of left female breast: Secondary | ICD-10-CM | POA: Diagnosis not present

## 2018-09-07 DIAGNOSIS — Z79811 Long term (current) use of aromatase inhibitors: Secondary | ICD-10-CM | POA: Diagnosis not present

## 2018-09-07 DIAGNOSIS — C773 Secondary and unspecified malignant neoplasm of axilla and upper limb lymph nodes: Secondary | ICD-10-CM | POA: Diagnosis not present

## 2018-09-07 DIAGNOSIS — M8589 Other specified disorders of bone density and structure, multiple sites: Secondary | ICD-10-CM | POA: Diagnosis not present

## 2018-09-07 DIAGNOSIS — C50912 Malignant neoplasm of unspecified site of left female breast: Secondary | ICD-10-CM | POA: Diagnosis not present

## 2018-09-07 DIAGNOSIS — Z780 Asymptomatic menopausal state: Secondary | ICD-10-CM | POA: Diagnosis not present

## 2018-09-15 MED FILL — ANASTROZOLE 1 MG TABLET: 1 | 90 days supply | Qty: 90 | Fill #3

## 2018-10-04 DIAGNOSIS — Z9013 Acquired absence of bilateral breasts and nipples: Secondary | ICD-10-CM | POA: Diagnosis not present

## 2018-10-04 DIAGNOSIS — R5383 Other fatigue: Secondary | ICD-10-CM | POA: Diagnosis not present

## 2018-10-04 DIAGNOSIS — C773 Secondary and unspecified malignant neoplasm of axilla and upper limb lymph nodes: Secondary | ICD-10-CM | POA: Diagnosis not present

## 2018-10-04 DIAGNOSIS — C50912 Malignant neoplasm of unspecified site of left female breast: Secondary | ICD-10-CM | POA: Diagnosis not present

## 2018-10-04 DIAGNOSIS — Z79899 Other long term (current) drug therapy: Secondary | ICD-10-CM | POA: Diagnosis not present

## 2018-10-04 DIAGNOSIS — Z170 Estrogen receptor positive status [ER+]: Secondary | ICD-10-CM | POA: Diagnosis not present

## 2018-10-04 DIAGNOSIS — C50412 Malignant neoplasm of upper-outer quadrant of left female breast: Secondary | ICD-10-CM | POA: Diagnosis not present

## 2018-10-04 DIAGNOSIS — F39 Unspecified mood [affective] disorder: Secondary | ICD-10-CM | POA: Diagnosis not present

## 2018-10-04 DIAGNOSIS — Z79811 Long term (current) use of aromatase inhibitors: Secondary | ICD-10-CM | POA: Diagnosis not present

## 2018-10-04 DIAGNOSIS — Z23 Encounter for immunization: Secondary | ICD-10-CM | POA: Diagnosis not present

## 2018-12-13 MED FILL — ANASTROZOLE 1 MG TABLET: 1 | 90 days supply | Qty: 90 | Fill #0

## 2018-12-18 DIAGNOSIS — Z170 Estrogen receptor positive status [ER+]: Secondary | ICD-10-CM | POA: Diagnosis not present

## 2018-12-18 DIAGNOSIS — A0472 Enterocolitis due to Clostridium difficile, not specified as recurrent: Secondary | ICD-10-CM | POA: Diagnosis not present

## 2018-12-18 DIAGNOSIS — J069 Acute upper respiratory infection, unspecified: Secondary | ICD-10-CM | POA: Diagnosis not present

## 2018-12-18 DIAGNOSIS — C50812 Malignant neoplasm of overlapping sites of left female breast: Secondary | ICD-10-CM | POA: Diagnosis not present

## 2019-02-07 DIAGNOSIS — Z170 Estrogen receptor positive status [ER+]: Secondary | ICD-10-CM | POA: Diagnosis not present

## 2019-02-07 DIAGNOSIS — Z9013 Acquired absence of bilateral breasts and nipples: Secondary | ICD-10-CM | POA: Diagnosis not present

## 2019-02-07 DIAGNOSIS — C50412 Malignant neoplasm of upper-outer quadrant of left female breast: Secondary | ICD-10-CM | POA: Diagnosis not present

## 2019-02-07 DIAGNOSIS — C773 Secondary and unspecified malignant neoplasm of axilla and upper limb lymph nodes: Secondary | ICD-10-CM | POA: Diagnosis not present

## 2019-02-07 DIAGNOSIS — C50912 Malignant neoplasm of unspecified site of left female breast: Secondary | ICD-10-CM | POA: Diagnosis not present

## 2019-02-16 DIAGNOSIS — Z9013 Acquired absence of bilateral breasts and nipples: Secondary | ICD-10-CM | POA: Diagnosis not present

## 2019-02-16 DIAGNOSIS — Z853 Personal history of malignant neoplasm of breast: Secondary | ICD-10-CM | POA: Diagnosis not present

## 2019-02-16 DIAGNOSIS — Z9221 Personal history of antineoplastic chemotherapy: Secondary | ICD-10-CM | POA: Diagnosis not present

## 2019-02-16 DIAGNOSIS — M25512 Pain in left shoulder: Secondary | ICD-10-CM | POA: Diagnosis not present

## 2019-02-16 DIAGNOSIS — M25612 Stiffness of left shoulder, not elsewhere classified: Secondary | ICD-10-CM | POA: Diagnosis not present

## 2019-02-16 DIAGNOSIS — Z923 Personal history of irradiation: Secondary | ICD-10-CM | POA: Diagnosis not present

## 2019-02-16 DIAGNOSIS — G8929 Other chronic pain: Secondary | ICD-10-CM | POA: Diagnosis not present

## 2019-02-16 DIAGNOSIS — Z87891 Personal history of nicotine dependence: Secondary | ICD-10-CM | POA: Diagnosis not present

## 2019-02-22 DIAGNOSIS — C50412 Malignant neoplasm of upper-outer quadrant of left female breast: Secondary | ICD-10-CM | POA: Diagnosis not present

## 2019-02-22 DIAGNOSIS — M25512 Pain in left shoulder: Secondary | ICD-10-CM | POA: Diagnosis not present

## 2019-02-22 DIAGNOSIS — M25612 Stiffness of left shoulder, not elsewhere classified: Secondary | ICD-10-CM | POA: Diagnosis not present

## 2019-02-22 DIAGNOSIS — Z170 Estrogen receptor positive status [ER+]: Secondary | ICD-10-CM | POA: Diagnosis not present

## 2019-02-22 DIAGNOSIS — Z853 Personal history of malignant neoplasm of breast: Secondary | ICD-10-CM | POA: Diagnosis not present

## 2019-02-22 DIAGNOSIS — Z923 Personal history of irradiation: Secondary | ICD-10-CM | POA: Diagnosis not present

## 2019-02-22 DIAGNOSIS — Z9221 Personal history of antineoplastic chemotherapy: Secondary | ICD-10-CM | POA: Diagnosis not present

## 2019-02-22 DIAGNOSIS — G8929 Other chronic pain: Secondary | ICD-10-CM | POA: Diagnosis not present

## 2019-02-22 DIAGNOSIS — Z87891 Personal history of nicotine dependence: Secondary | ICD-10-CM | POA: Diagnosis not present

## 2019-02-22 DIAGNOSIS — Z9013 Acquired absence of bilateral breasts and nipples: Secondary | ICD-10-CM | POA: Diagnosis not present

## 2019-03-21 MED FILL — ANASTROZOLE 1 MG TABLET: 1 | 90 days supply | Qty: 90 | Fill #1

## 2019-04-04 DIAGNOSIS — Z170 Estrogen receptor positive status [ER+]: Secondary | ICD-10-CM | POA: Diagnosis not present

## 2019-04-04 DIAGNOSIS — Z881 Allergy status to other antibiotic agents status: Secondary | ICD-10-CM | POA: Diagnosis not present

## 2019-04-04 DIAGNOSIS — D649 Anemia, unspecified: Secondary | ICD-10-CM | POA: Diagnosis not present

## 2019-04-04 DIAGNOSIS — Z9013 Acquired absence of bilateral breasts and nipples: Secondary | ICD-10-CM | POA: Diagnosis not present

## 2019-04-04 DIAGNOSIS — F39 Unspecified mood [affective] disorder: Secondary | ICD-10-CM | POA: Diagnosis not present

## 2019-04-04 DIAGNOSIS — Z79899 Other long term (current) drug therapy: Secondary | ICD-10-CM | POA: Diagnosis not present

## 2019-04-04 DIAGNOSIS — R5383 Other fatigue: Secondary | ICD-10-CM | POA: Diagnosis not present

## 2019-04-04 DIAGNOSIS — A0472 Enterocolitis due to Clostridium difficile, not specified as recurrent: Secondary | ICD-10-CM | POA: Diagnosis not present

## 2019-04-04 DIAGNOSIS — C50912 Malignant neoplasm of unspecified site of left female breast: Secondary | ICD-10-CM | POA: Diagnosis not present

## 2019-04-04 DIAGNOSIS — C773 Secondary and unspecified malignant neoplasm of axilla and upper limb lymph nodes: Secondary | ICD-10-CM | POA: Diagnosis not present

## 2019-04-04 DIAGNOSIS — C50412 Malignant neoplasm of upper-outer quadrant of left female breast: Secondary | ICD-10-CM | POA: Diagnosis not present

## 2019-04-26 DIAGNOSIS — G8929 Other chronic pain: Secondary | ICD-10-CM | POA: Diagnosis not present

## 2019-04-26 DIAGNOSIS — M79622 Pain in left upper arm: Secondary | ICD-10-CM | POA: Diagnosis not present

## 2019-04-26 DIAGNOSIS — M25512 Pain in left shoulder: Secondary | ICD-10-CM | POA: Diagnosis not present

## 2019-04-26 DIAGNOSIS — M25612 Stiffness of left shoulder, not elsewhere classified: Secondary | ICD-10-CM | POA: Diagnosis not present

## 2019-05-07 ENCOUNTER — Institutional Professional Consult (permissible substitution): Payer: 59 | Admitting: Plastic Surgery

## 2019-05-29 ENCOUNTER — Institutional Professional Consult (permissible substitution): Payer: 59 | Admitting: Plastic Surgery

## 2019-06-14 DIAGNOSIS — Z452 Encounter for adjustment and management of vascular access device: Secondary | ICD-10-CM | POA: Diagnosis not present

## 2019-06-14 MED FILL — ANASTROZOLE 1 MG TABLET: 1 | 90 days supply | Qty: 90 | Fill #2

## 2019-07-30 DIAGNOSIS — Z9013 Acquired absence of bilateral breasts and nipples: Secondary | ICD-10-CM | POA: Diagnosis not present

## 2019-07-30 DIAGNOSIS — Z170 Estrogen receptor positive status [ER+]: Secondary | ICD-10-CM | POA: Diagnosis not present

## 2019-07-30 DIAGNOSIS — C50412 Malignant neoplasm of upper-outer quadrant of left female breast: Secondary | ICD-10-CM | POA: Diagnosis not present

## 2019-08-01 DIAGNOSIS — Z87891 Personal history of nicotine dependence: Secondary | ICD-10-CM | POA: Diagnosis not present

## 2019-08-01 DIAGNOSIS — N898 Other specified noninflammatory disorders of vagina: Secondary | ICD-10-CM | POA: Diagnosis not present

## 2019-08-01 DIAGNOSIS — Z853 Personal history of malignant neoplasm of breast: Secondary | ICD-10-CM | POA: Diagnosis not present

## 2019-08-01 DIAGNOSIS — Z01419 Encounter for gynecological examination (general) (routine) without abnormal findings: Secondary | ICD-10-CM | POA: Diagnosis not present

## 2019-08-01 DIAGNOSIS — N920 Excessive and frequent menstruation with regular cycle: Secondary | ICD-10-CM | POA: Diagnosis not present

## 2019-08-20 ENCOUNTER — Telehealth: Payer: Self-pay

## 2019-08-20 NOTE — Telephone Encounter (Signed)

## 2019-08-21 ENCOUNTER — Encounter: Payer: Self-pay | Admitting: Plastic Surgery

## 2019-08-21 ENCOUNTER — Ambulatory Visit: Payer: 59 | Admitting: Plastic Surgery

## 2019-08-21 ENCOUNTER — Other Ambulatory Visit: Payer: Self-pay

## 2019-08-21 DIAGNOSIS — Z9013 Acquired absence of bilateral breasts and nipples: Secondary | ICD-10-CM | POA: Diagnosis not present

## 2019-08-21 DIAGNOSIS — Z923 Personal history of irradiation: Secondary | ICD-10-CM | POA: Diagnosis not present

## 2019-08-21 NOTE — Progress Notes (Signed)
Patient ID: Jamie Hartman, female    DOB: [birth year], 58 y.o.   MRN: [id number]   Chief Complaint  Patient presents with  . Breast Problem    The patient is a 58 yrs old wf here for evaluation of her breasts.  She underwent bilateral breast reconstruction in September 2019.  She was found to have left breast cancer and had bilateral mastectomies.  She had expanders put in.  She thinks they were put in above the muscle.  She then got radiated on the left side.  She had multiple lymph nodes removed from the left.  Once the radiation was finished she had the implant placed.  She has [place] high profile [id number] [id number] silicone implants on each side.  She feels the left is very tight in general and also very tight in her axilla.  I see what she is talking about I also think that in light of the radiation the result is good.  To do anything would be risky due to the radiation.  I do not feel any lumps or bumps.  Patient is interested in nipple areola reconstruction.   Review of Systems  Constitutional: Negative for activity change and appetite change.  HENT: Negative.   Eyes: Negative.   Respiratory: Positive for chest tightness. Negative for shortness of breath.   Cardiovascular: Negative for leg swelling.  Gastrointestinal: Negative for abdominal distention.  Endocrine: Negative.   Genitourinary: Negative.   Musculoskeletal: Negative.   Hematological: Negative.   Psychiatric/Behavioral: Negative.     History reviewed. No pertinent past medical history.  Past Surgical History:  Procedure Laterality Date  . LEEP        Current Outpatient Medications:  .  amoxicillin-clavulanate (AUGMENTIN) 875-125 MG tablet, Take 1 tablet by mouth 2 (two) times daily. One po bid x 7 days, Disp: 14 tablet, Rfl: 0 .  aspirin 81 MG tablet, Take 81 mg by mouth daily., Disp: , Rfl:  .  metroNIDAZOLE (FLAGYL) 500 MG tablet, Take 500 mg by mouth 3 (three) times daily., Disp: , Rfl:  .  nitrofurantoin  (MACRODANTIN) 100 MG capsule, Take 100 mg by mouth 4 (four) times daily., Disp: , Rfl:  .  predniSONE (DELTASONE) 20 MG tablet, 3 tabs po day one, then 2 po daily x 4 days, Disp: 11 tablet, Rfl: 0   Objective:   Vitals:   08/21/19 1227  BP: 121/75  Pulse: 65  Resp: 16  Temp: 98.4 F (36.9 C)  SpO2: 100%    Physical Exam Vitals signs and nursing note reviewed.  Constitutional:      Appearance: Normal appearance.  HENT:     Head: Normocephalic and atraumatic.  Cardiovascular:     Rate and Rhythm: Normal rate.     Pulses: Normal pulses.  Pulmonary:     Effort: Pulmonary effort is normal.  Abdominal:     General: Abdomen is flat. There is no distension.     Tenderness: There is no abdominal tenderness.  Musculoskeletal: Normal range of motion.  Skin:    General: Skin is warm.  Neurological:     General: No focal deficit present.     Mental Status: She is alert and oriented to person, place, and time.  Psychiatric:        Behavior: Behavior normal.        Thought Content: Thought content normal.     Assessment & Plan:  Acquired absence of breast and absent nipple, bilateral  S/P radiotherapy  '  We discussed the options for revision.  Most likely she would need autologous reconstruction with a latissimus muscle flap or a rectus muscle.  She could also have touchup surgery with fat grafting.  This would help some.  This would be minimal risk compared to trying to exchange the implants without autologous tissue she acknowledged Dr. Delice Lesch talking to her about these options.  At this time I recommend nipple areole a tattoo and laser for the prominent vessels. The patient is in agreement. Pictures were obtained of the patient and placed in the chart with the patient's or guardian's permission.  Daniel, DO

## 2019-08-22 DIAGNOSIS — N950 Postmenopausal bleeding: Secondary | ICD-10-CM | POA: Diagnosis not present

## 2019-08-22 DIAGNOSIS — Z79811 Long term (current) use of aromatase inhibitors: Secondary | ICD-10-CM | POA: Diagnosis not present

## 2019-08-22 DIAGNOSIS — N939 Abnormal uterine and vaginal bleeding, unspecified: Secondary | ICD-10-CM | POA: Diagnosis not present

## 2019-08-22 DIAGNOSIS — D259 Leiomyoma of uterus, unspecified: Secondary | ICD-10-CM | POA: Diagnosis not present

## 2019-08-22 DIAGNOSIS — Z853 Personal history of malignant neoplasm of breast: Secondary | ICD-10-CM | POA: Diagnosis not present

## 2019-08-28 DIAGNOSIS — C50412 Malignant neoplasm of upper-outer quadrant of left female breast: Secondary | ICD-10-CM | POA: Diagnosis not present

## 2019-08-28 DIAGNOSIS — Z170 Estrogen receptor positive status [ER+]: Secondary | ICD-10-CM | POA: Diagnosis not present

## 2019-08-29 DIAGNOSIS — C50912 Malignant neoplasm of unspecified site of left female breast: Secondary | ICD-10-CM | POA: Diagnosis not present

## 2019-09-11 ENCOUNTER — Ambulatory Visit (INDEPENDENT_AMBULATORY_CARE_PROVIDER_SITE_OTHER): Payer: Self-pay | Admitting: Plastic Surgery

## 2019-09-11 ENCOUNTER — Encounter: Payer: Self-pay | Admitting: Plastic Surgery

## 2019-09-11 ENCOUNTER — Other Ambulatory Visit: Payer: Self-pay

## 2019-09-11 DIAGNOSIS — Z719 Counseling, unspecified: Secondary | ICD-10-CM

## 2019-09-11 NOTE — Progress Notes (Signed)
rescheduled

## 2019-09-17 ENCOUNTER — Ambulatory Visit (INDEPENDENT_AMBULATORY_CARE_PROVIDER_SITE_OTHER): Payer: Self-pay

## 2019-09-17 ENCOUNTER — Other Ambulatory Visit: Payer: Self-pay

## 2019-09-17 DIAGNOSIS — Z719 Counseling, unspecified: Secondary | ICD-10-CM

## 2019-09-17 MED FILL — ANASTROZOLE 1 MG TABLET: 1 | 90 days supply | Qty: 90 | Fill #0

## 2019-09-19 DIAGNOSIS — N950 Postmenopausal bleeding: Secondary | ICD-10-CM | POA: Diagnosis not present

## 2019-09-19 DIAGNOSIS — C50919 Malignant neoplasm of unspecified site of unspecified female breast: Secondary | ICD-10-CM | POA: Diagnosis not present

## 2019-09-19 DIAGNOSIS — Z853 Personal history of malignant neoplasm of breast: Secondary | ICD-10-CM | POA: Diagnosis not present

## 2019-09-19 DIAGNOSIS — Z79811 Long term (current) use of aromatase inhibitors: Secondary | ICD-10-CM | POA: Diagnosis not present

## 2019-09-19 DIAGNOSIS — Z87891 Personal history of nicotine dependence: Secondary | ICD-10-CM | POA: Diagnosis not present

## 2019-09-19 DIAGNOSIS — N888 Other specified noninflammatory disorders of cervix uteri: Secondary | ICD-10-CM | POA: Diagnosis not present

## 2019-10-15 NOTE — Progress Notes (Signed)
Preoperative Dx: hyperpigmentation  Postoperative Dx:  same  Procedure: laser to face   Anesthesia: none  Description of Procedure:  Risks and complications were explained to the patient. Consent was confirmed and signed. Time out was called and all information was confirmed to be correct. The area  area was prepped with alcohol and wiped dry. The IPL laser was set at 7 J/cm2. The face was lasered. The patient tolerated the procedure well and there were no complications. The patient is to follow up in 4 weeks.    

## 2019-10-16 ENCOUNTER — Other Ambulatory Visit: Payer: Self-pay | Admitting: Plastic Surgery

## 2019-12-14 MED FILL — ANASTROZOLE 1 MG TABLET: 1 | 90 days supply | Qty: 90 | Fill #1

## 2020-02-06 DIAGNOSIS — R0781 Pleurodynia: Secondary | ICD-10-CM | POA: Insufficient documentation

## 2020-02-20 DIAGNOSIS — M899 Disorder of bone, unspecified: Secondary | ICD-10-CM | POA: Insufficient documentation

## 2020-03-13 MED FILL — ANASTROZOLE 1 MG TABLET: 1 | 90 days supply | Qty: 90 | Fill #0

## 2020-04-03 ENCOUNTER — Telehealth: Payer: Self-pay | Admitting: *Deleted

## 2020-04-03 NOTE — Telephone Encounter (Signed)
This RN spoke with pt per her VM inquiring about obtaining an appointment for 2nd opinion.  Noted appointment made on 03/31/2020 for 04/09/2020- no documentation of patient notification.  This RN called pt and informed her of above- including location of facility.  No further questions at this time.  Of note lab appointment cancelled due to pt having labs done under active oncology care per Dr Humphrey Rolls at [facility]

## 2020-04-08 ENCOUNTER — Encounter: Payer: Self-pay | Admitting: Oncology

## 2020-04-08 ENCOUNTER — Other Ambulatory Visit: Payer: Self-pay

## 2020-04-08 DIAGNOSIS — Z9013 Acquired absence of bilateral breasts and nipples: Secondary | ICD-10-CM

## 2020-04-09 ENCOUNTER — Inpatient Hospital Stay: Payer: Medicare HMO

## 2020-04-09 ENCOUNTER — Inpatient Hospital Stay: Payer: Medicare HMO | Admitting: Oncology

## 2020-04-09 ENCOUNTER — Telehealth: Payer: Self-pay | Admitting: Oncology

## 2020-04-09 ENCOUNTER — Other Ambulatory Visit: Payer: 59

## 2020-04-09 ENCOUNTER — Other Ambulatory Visit: Payer: Self-pay | Admitting: Oncology

## 2020-04-09 NOTE — Telephone Encounter (Signed)
Lft vm to reschedule appt w/Dr. [name]

## 2020-04-09 NOTE — Telephone Encounter (Signed)
I received a scheduling msg to r/s Ms. Zender's new pt appt w/Dr. [name]. I cld and left the pt a voicemail to return my call to make a new appt.

## 2020-04-11 ENCOUNTER — Other Ambulatory Visit: Payer: Self-pay | Admitting: Oncology

## 2020-04-14 ENCOUNTER — Telehealth: Payer: Self-pay | Admitting: Oncology

## 2020-04-14 NOTE — Telephone Encounter (Signed)
Mrs. Kefauver returned my call to reschedule her new pt appt w/dr. [name] to / at 4pm w/labs at 330pm. Pt aware to arrive 15 minutes early.

## 2020-04-16 NOTE — Progress Notes (Addendum)
[name]  Telephone:([phone]) [phone] [phone]) [phone]     ID: Jamie Hartman DOB: 1961/03/08  MR#: [id number]  EBR#:[id number]  Patient Care Team: Edmonia James, PA-C as PCP - General (Physician Assistant) Chauncey Cruel, MD OTHER MD:  CHIEF COMPLAINT: Invasive lobular breast cancer, stage IV, triple negative  CURRENT TREATMENT: Consider capecitabine   HISTORY OF CURRENT ILLNESS: Jamie Hartman 's breast cancer history dates back to February 2018 when she noted left nipple retraction.  Her subsequent evaluation and treatment is detailed below, but basically she received neoadjuvant chemotherapy with docetaxel doxorubicin and cyclophosphamide and proceeded to mastectomy which unfortunately showed T3 N2 residual disease, with all 10 axillary lymph nodes removed positive.  She received adjuvant radiation and then was started on anastrozole.  More recently the had a CT scan of the chest with contrast to evaluate her ongoing left-sided chest wall pain.  This showed several new sclerotic lesions in the thoracic vertebrae but no visceral disease.  PET scan 02/18/2020 showed again no visceral uptake but widespread bony metastatic disease through the axial and appendicular skeleton.  Thoracic and lumbar spine MRI confirmed multiple areas of metastatic disease to bone without evidence of neural impingement.  On 03/28/2020 the patient underwent CT-guided biopsy of the right iliac bone, and this showed (HBS [phone]) metastatic carcinoma consistent with lobular breast cancer, but now triple negative, with an MIB-1 of 20%.  Her subsequent history is as detailed below  INTERVAL HISTORY: Jamie Hartman was evaluated in the breast cancer clinic 04/17/2020 accompanied by her husband Joe  REVIEW OF SYSTEMS: [name] tells me she tolerated anastrozole well, without any unusual complications.  She had an episode of C. difficile colitis associated with her original chemotherapy and more recently had  a rotavirus episode which caused some migraines.  She still feels a little dizzy and has some headaches at times.  She feels short of breath, although she can get to the top of the stairs without stopping.  She does have evidence of emphysema on the scans.  She is moderately constipated.  She exercises 30 minutes most mornings.  There have been no intercurrent fevers, rash, bleeding, and she has a stable weight.  She denies pain, and also nausea, vomiting, altered taste, or loss of appetite..  A detailed review of systems today was otherwise noncontributory   PAST MEDICAL HISTORY: Past Medical History:  Diagnosis Date  . Breast cancer ([place]) 01/2017   metastatic lobular carcinoma    PAST SURGICAL HISTORY: Past Surgical History:  Procedure Laterality Date  . BILATERAL TOTAL MASTECTOMY WITH AXILLARY LYMPH NODE DISSECTION  09/04/2017  . LEEP      FAMILY HISTORY: The patient's father died at 60 from complications of diabetes.  He had a sister diagnosed with breast cancer.  The patient's mother died at 16 from complications of emphysema.  Her mother, the patient's maternal grandmother, had pancreatic cancer and a maternal aunt had a history of breast cancer.  The patient herself has a brother, with no history of cancer.  She has no sisters.   GYNECOLOGIC HISTORY:  No LMP recorded. Patient is postmenopausal. Menarche: 59 years old GX P 0 LMP 59 HRT no  Hysterectomy?  No BSO?  No   SOCIAL HISTORY: (updated May 2021)  Jamie Hartman lives with her husband Wille Glaser and 4 cats.  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    ADVANCED DIRECTIVES: In the absence of any document to the contrary the patient's husband is her healthcare power of attorney.  HEALTH MAINTENANCE: Social History   Tobacco Use  . Smoking status: Current Every Day Smoker    Packs/day: 0.50    Types: Cigarettes  . Smokeless tobacco: Never Used  Substance Use Topics  . Alcohol use: Yes  . Drug use: Not on file     Colonoscopy:  2020  PAP: Up-to-date  Bone density: 2019   Allergies  Allergen Reactions  . Pantoprazole Nausea Only, Other (See Comments) and Shortness Of Breath    Leg cramps, headaches Leg cramps, headaches Leg cramps, headaches Leg cramps, headaches   . Adhesive  [Tape] Dermatitis    Band aids  . Avelox [Moxifloxacin Hcl In Nacl]     Severe headache  . Ciprofloxacin Rash    Current Outpatient Medications  Medication Sig Dispense Refill  . ALPRAZolam (XANAX) 0.5 MG tablet Take by mouth.    Marland Kitchen anastrozole (ARIMIDEX) 1 MG tablet Take by mouth.    . prochlorperazine (COMPAZINE) 10 MG tablet Take by mouth.    Marland Kitchen VITAMIN D, ERGOCALCIFEROL, PO Take 50,000 Units by mouth. 1 po q 2 weeks    . ondansetron (ZOFRAN-ODT) 8 MG disintegrating tablet Take by mouth.    . valACYclovir (VALTREX) 500 MG tablet 1 tablet bid x 3 days    . Zoledronic Acid (ZOMETA) 4 MG/100ML IVPB Inject into the vein.     No current facility-administered medications for this visit.    OBJECTIVE: White woman who appears stated age  59:   04/17/20 1541  BP: 120/64  Pulse: 94  Resp: 18  Temp: 98 F (36.7 C)  SpO2: 97%     Body mass index is 23.43 kg/m.   Wt Readings from Last 3 Encounters:  04/17/20 128 lb 1.6 oz (58.1 kg)  08/21/19 130 lb (59 kg)  03/04/16 129 lb (58.5 kg)      ECOG FS:1 - Symptomatic but completely ambulatory  Ocular: Sclerae unicteric, pupils round and equal Ear-nose-throat: Wearing a mask Lymphatic: No cervical or supraclavicular adenopathy Lungs no rales or rhonchi Heart regular rate and rhythm Abd soft, nontender, positive bowel sounds MSK no focal spinal tenderness, no joint edema Neuro: non-focal, well-oriented, appropriate affect Breasts: Status post bilateral mastectomies with bilateral silicone implants.  There is no evidence of local recurrence.  Both axillae are benign.   LAB RESULTS:  CMP     Component Value Date/Time   NA 142 04/17/2020 1521   K 4.5 04/17/2020 1521    CL 105 04/17/2020 1521   CO2 24 04/17/2020 1521   GLUCOSE 93 04/17/2020 1521   BUN 16 04/17/2020 1521   CREATININE 0.99 04/17/2020 1521   CALCIUM 10.3 04/17/2020 1521   PROT 7.1 04/17/2020 1521   ALBUMIN 3.6 04/17/2020 1521   AST 227 (HH) 04/17/2020 1521   ALT 91 (H) 04/17/2020 1521   ALKPHOS 180 (H) 04/17/2020 1521   BILITOT 0.7 04/17/2020 1521   GFRNONAA >60 04/17/2020 1521   GFRAA >60 04/17/2020 1521    No results found for: TOTALPROTELP, ALBUMINELP, A1GS, A2GS, BETS, BETA2SER, GAMS, MSPIKE, SPEI  Lab Results  Component Value Date   WBC 5.7 04/17/2020   NEUTROABS 2.2 04/17/2020   HGB 10.0 (L) 04/17/2020   HCT 30.6 (L) 04/17/2020   MCV 86.9 04/17/2020   PLT 24 (L) 04/17/2020    No results found for: LABCA2  No components found for: OYDXAJ287  No results for input(s): INR in the last 168 hours.  No results found for: LABCA2  No results found for: OMV672  No results found for: QIH474  No results found for: QVZ563  No results found for: CA2729  No components found for: HGQUANT  No results found for: CEA1 / No results found for: CEA1   No results found for: AFPTUMOR  No results found for: CHROMOGRNA  No results found for: KPAFRELGTCHN, LAMBDASER, KAPLAMBRATIO (kappa/lambda light chains)  No results found for: HGBA, HGBA2QUANT, HGBFQUANT, HGBSQUAN (Hemoglobinopathy evaluation)   No results found for: LDH  No results found for: IRON, TIBC, IRONPCTSAT (Iron and TIBC)  No results found for: FERRITIN  Urinalysis    Component Value Date/Time   BILIRUBINUR negative 03/04/2016 1453   KETONESUR trace (5) (A) 03/04/2016 1453   PROTEINUR negative 03/04/2016 1453   UROBILINOGEN 0.2 03/04/2016 1453   NITRITE Negative 03/04/2016 1453   LEUKOCYTESUR Negative 03/04/2016 1453     STUDIES: No results found.   ELIGIBLE FOR AVAILABLE RESEARCH PROTOCOL: To be determined  ASSESSMENT: 59 y.o. [place] woman with metastatic lobular breast cancer as  follows:  (1) status post left breast overlapping and axillary lymph node biopsy 01/26/2017 for a clinical mT2 N1, stage IIA invasive lobular carcinoma, strongly estrogen and progesterone receptor positive, HER-2 not amplified  (2) treated neoadjuvantly with docetaxel, doxorubicin and cyclophosphamide for 6 cycles, between 03/10/2017 and 07/01/2017  (3) status post bilateral mastectomies 08/25/2017 showing residual invasive lobular carcinoma, ypT3 ypN3, stage IIIB  (4) status post adjuvant radiation to the left breast and axilla 11/30/2017 through 01/10/2018.  (5) anastrozole started 09/22/2017  METASTATIC DISEASE (6) CT scan of the chest 02/11/2020, PET scan 02/18/2020 an MRI of the thoracolumbar spine 03/12/2020 show multiple bone lesions (axial and appendicular) without neural impingement and without evidence of visceral disease  (7) CT-guided right iliac crest biopsy 03/28/2020 confirms metastatic invasive lobular breast cancer, now triple negative, with an MIB-1 of 20%   PLAN: I reviewed Anise's history with her and her husband both to make sure that I had the right data and also to remind them of some important facts regarding lobular breast cancer.  Specifically these cancers, unlike ductal breast cancers, do not make E-cadherin.  This is a glue that holds breast cancer cells together.  Accordingly with ductal cancer we see well-defined tumor nodules in soft tissues.  With lobular breast cancer we see a diffuse pattern of infiltration which is very difficult to image.  This is why her postchemotherapy MRI was essentially negative, and why the CT of the chest and PET scan are unlikely to demonstrate visceral disease, which may well be there.  We have a new type of scan called Cerianna or F-18 estradiol PET scan, which unlike the usual PET scan specifically targets estrogen eating tissues.  I think it would be useful to obtain this for Cniyah.  If the scan is diffusely positive then it may  be that the triple negativity found on the iliac bone biopsy is artifactual and perhaps due to decalcification.  It may also show other targets to biopsy if necessary.  In addition her labs today show worsening liver function tests.  She tells me she has been taking quite a bit of Tylenol.  It may be that we can document liver involvement with a liver MRI and if so that might provide a visceral target which would allow [place] to get additional molecular testing.  She is agreeable to both those tests.  We then reviewed the fact that we are dealing with stage IV, metastatic disease. She understands that stage IV breast cancer is not  curable with our current knowledge base. The goal of treatment is control. The strategy of treatment is to do only the minimum necessary to control the growth of the tumor so that the patient can have as normal a life as possible. There is no survival advantage in treating aggressively if treating less aggressively results in tumor control. With this strategy stage IV breast cancer in many cases can function as a "chronic illness": something that cannot be quite gotten rid of but can be controlled for an indefinite period of time  Treatment options for triple negative breast cancer include chemotherapy (capecitabine, CMF, and others); sacituzumab govitecan; and if the tumor carries appropriate mutations, alpelisib, atezolizumab or oliparib.  Of course if the tumor proves to be estrogen receptor positive after all we would have multiple additional treatment options.  At this point Reid is interested in pursuing the cerianna scan, the liver MRI, and capecitabine.  I will try to get these arranged for her as soon as possible and she will return to see me 05/01/2020.  She knows to call for any other issue that may develop before then.  Total encounter time 70 minutes.Sarajane Jews C. Omaya Nieland, MD 04/17/2020 5:20 PM Medical Oncology and Hematology [facility] [place], [place] [address] Tel. [phone]    Fax. [phone]  Addendum 04/24/2020: Insurance would not approve the liver MRI.  They say that if this Aryanna scan shows an abnormality in the liver than that can be biopsied.  Accordingly if this Aryanna scan is negative we will go back and try to get the MRI of the liver approved.   This document serves as a record of services personally performed by Lurline Del, MD. It was created on his behalf by Wilburn Mylar, a trained medical scribe. The creation of this record is based on the scribe's personal observations and the provider's statements to them.   I, Lurline Del MD, have reviewed the above documentation for accuracy and completeness, and I agree with the above.    *Total Encounter Time as defined by the Centers for Medicare and Medicaid Services includes, in addition to the face-to-face time of a patient visit (documented in the note above) non-face-to-face time: obtaining and reviewing outside history, ordering and reviewing medications, tests or procedures, care coordination (communications with other health care professionals or caregivers) and documentation in the medical record.

## 2020-04-17 ENCOUNTER — Other Ambulatory Visit: Payer: Self-pay | Admitting: *Deleted

## 2020-04-17 ENCOUNTER — Inpatient Hospital Stay: Payer: Medicare HMO

## 2020-04-17 ENCOUNTER — Inpatient Hospital Stay: Payer: Medicare HMO | Attending: Oncology | Admitting: Oncology

## 2020-04-17 ENCOUNTER — Other Ambulatory Visit: Payer: Self-pay

## 2020-04-17 DIAGNOSIS — C50812 Malignant neoplasm of overlapping sites of left female breast: Secondary | ICD-10-CM | POA: Diagnosis not present

## 2020-04-17 DIAGNOSIS — K5900 Constipation, unspecified: Secondary | ICD-10-CM

## 2020-04-17 DIAGNOSIS — F17210 Nicotine dependence, cigarettes, uncomplicated: Secondary | ICD-10-CM | POA: Diagnosis not present

## 2020-04-17 DIAGNOSIS — Z803 Family history of malignant neoplasm of breast: Secondary | ICD-10-CM | POA: Insufficient documentation

## 2020-04-17 DIAGNOSIS — J439 Emphysema, unspecified: Secondary | ICD-10-CM | POA: Insufficient documentation

## 2020-04-17 DIAGNOSIS — Z170 Estrogen receptor positive status [ER+]: Secondary | ICD-10-CM

## 2020-04-17 DIAGNOSIS — Z9013 Acquired absence of bilateral breasts and nipples: Secondary | ICD-10-CM

## 2020-04-17 DIAGNOSIS — C773 Secondary and unspecified malignant neoplasm of axilla and upper limb lymph nodes: Secondary | ICD-10-CM

## 2020-04-17 DIAGNOSIS — I700 Atherosclerosis of aorta: Secondary | ICD-10-CM

## 2020-04-17 DIAGNOSIS — Z923 Personal history of irradiation: Secondary | ICD-10-CM | POA: Diagnosis not present

## 2020-04-17 DIAGNOSIS — Z5111 Encounter for antineoplastic chemotherapy: Secondary | ICD-10-CM | POA: Diagnosis not present

## 2020-04-17 DIAGNOSIS — R7989 Other specified abnormal findings of blood chemistry: Secondary | ICD-10-CM

## 2020-04-17 DIAGNOSIS — D696 Thrombocytopenia, unspecified: Secondary | ICD-10-CM | POA: Diagnosis not present

## 2020-04-17 DIAGNOSIS — Z171 Estrogen receptor negative status [ER-]: Secondary | ICD-10-CM

## 2020-04-17 DIAGNOSIS — C7951 Secondary malignant neoplasm of bone: Secondary | ICD-10-CM | POA: Insufficient documentation

## 2020-04-17 DIAGNOSIS — R0602 Shortness of breath: Secondary | ICD-10-CM | POA: Diagnosis not present

## 2020-04-17 DIAGNOSIS — C50912 Malignant neoplasm of unspecified site of left female breast: Secondary | ICD-10-CM

## 2020-04-17 DIAGNOSIS — J438 Other emphysema: Secondary | ICD-10-CM

## 2020-04-17 DIAGNOSIS — Z800 Family history of malignant neoplasm of digestive organs: Secondary | ICD-10-CM

## 2020-04-17 LAB — CBC WITH DIFFERENTIAL (CANCER CENTER ONLY)
Abs Immature Granulocytes: 0.29 K/uL — ABNORMAL HIGH (ref 0.00–0.07)
Band Neutrophils: 3 %
Basophils Absolute: 0 K/uL (ref 0.0–0.1)
Basophils Relative: 0 %
Blasts: 0 %
Eosinophils Absolute: 0.1 K/uL (ref 0.0–0.5)
Eosinophils Relative: 2 %
HCT: 30.6 % — ABNORMAL LOW (ref 36.0–46.0)
Hemoglobin: 10 g/dL — ABNORMAL LOW (ref 12.0–15.0)
Lymphocytes Relative: 49 %
Lymphs Abs: 2.8 K/uL (ref 0.7–4.0)
MCH: 28.4 pg (ref 26.0–34.0)
MCHC: 32.7 g/dL (ref 30.0–36.0)
MCV: 86.9 fL (ref 80.0–100.0)
Metamyelocytes Relative: 1 %
Monocytes Absolute: 0.3 K/uL (ref 0.1–1.0)
Monocytes Relative: 6 %
Myelocytes: 4 %
Neutro Abs: 2.2 K/uL (ref 1.7–7.7)
Neutrophils Relative %: 35 %
Other: 0 %
Platelet Count: 24 K/uL — ABNORMAL LOW (ref 150–400)
Promyelocytes Relative: 0 %
RBC: 3.52 MIL/uL — ABNORMAL LOW (ref 3.87–5.11)
RDW: 19.3 % — ABNORMAL HIGH (ref 11.5–15.5)
WBC Count: 5.7 K/uL (ref 4.0–10.5)
nRBC: 11.6 % — ABNORMAL HIGH (ref 0.0–0.2)
nRBC: 14 /100{WBCs} — ABNORMAL HIGH

## 2020-04-17 LAB — CMP (CANCER CENTER ONLY)
ALT: 91 U/L — ABNORMAL HIGH (ref 0–44)
AST: 227 U/L (ref 15–41)
Albumin: 3.6 g/dL (ref 3.5–5.0)
Alkaline Phosphatase: 180 U/L — ABNORMAL HIGH (ref 38–126)
Anion gap: 13 (ref 5–15)
BUN: 16 mg/dL (ref 6–20)
CO2: 24 mmol/L (ref 22–32)
Calcium: 10.3 mg/dL (ref 8.9–10.3)
Chloride: 105 mmol/L (ref 98–111)
Creatinine: 0.99 mg/dL (ref 0.44–1.00)
GFR, Est AFR Am: >60 mL/min (ref >60)
GFR, Estimated: >60 mL/min (ref >60)
Glucose, Bld: 93 mg/dL (ref 70–99)
Potassium: 4.5 mmol/L (ref 3.5–5.1)
Sodium: 142 mmol/L (ref 135–145)
Total Bilirubin: 0.7 mg/dL (ref 0.3–1.2)
Total Protein: 7.1 g/dL (ref 6.5–8.1)

## 2020-04-17 MED ORDER — CAPECITABINE 500 MG PO TABS: 1500.0000 mg | ORAL_TABLET | Freq: Two times a day (BID) | ORAL | 6 refills | Status: DC

## 2020-04-18 ENCOUNTER — Telehealth: Payer: Self-pay

## 2020-04-18 ENCOUNTER — Telehealth: Payer: Self-pay | Admitting: Oncology

## 2020-04-18 ENCOUNTER — Telehealth: Payer: Self-pay | Admitting: Pharmacist

## 2020-04-18 DIAGNOSIS — C50812 Malignant neoplasm of overlapping sites of left female breast: Secondary | ICD-10-CM

## 2020-04-18 DIAGNOSIS — Z170 Estrogen receptor positive status [ER+]: Secondary | ICD-10-CM

## 2020-04-18 MED ORDER — CAPECITABINE 500 MG PO TABS: 1500.0000 mg | ORAL_TABLET | Freq: Two times a day (BID) | ORAL | 6 refills | Status: DC

## 2020-04-18 NOTE — Telephone Encounter (Signed)
Oral Oncology Patient Advocate Encounter  Received notification from [place] that prior authorization for Xeloda is required.  PA submitted on CoverMyMeds Key [id number] Status is pending  Oral Oncology Clinic will continue to follow.  [place] Patient [place] Phone [phone] Fax [phone] 04/18/2020 3:56 PM

## 2020-04-18 NOTE — Telephone Encounter (Signed)
Oral Oncology Pharmacist Encounter  Received new prescription for Xeloda (capecitabine) for the treatment of metastatic triple negative breast cancer, planned duration until disease progression or unacceptable drug toxicity. Planned start 05/01/20  CMP from 04/17/20 assessed, no relevant lab abnormalities. Prescription dose and frequency assessed.   Current medication list in Epic reviewed, no DDIs with capecitabine identified.  Prescription has been e-scribed to the [facility] for benefits analysis and approval.  Oral Oncology Clinic will continue to follow for insurance authorization, copayment issues, initial counseling and start date.  Darl Pikes, PharmD, BCPS, BCOP, CPP Hematology/Oncology Clinical Pharmacist Practitioner [facility] [place] [phone]  04/18/2020 2:09 PM

## 2020-04-18 NOTE — Telephone Encounter (Signed)
Scheduled appts per 5/13 los. Pt confirmed appt date and time.

## 2020-04-22 ENCOUNTER — Telehealth: Payer: Self-pay | Admitting: *Deleted

## 2020-04-22 NOTE — Telephone Encounter (Addendum)
This RN spoke with pt per her call asking about if she is ok to get the Covid vaccine.  She states she recently had " norovirus and had to have 2 bags of fluid due to having the dry heaves"  This RN reviewed chart and informed her overall Covid vaccine is recommended but she may want to have her lab rechecked due to noted low platelets at last visit.  She denies any symptoms of bleeding from gums, urine or stool. She does not have bruises " but I do have some dark spots on my fingers "  Pt is scheduled to obtain recheck on 05/01/2020 with next scheduled visit.  Olivya asked also about " satellite clinics " under our care that she could use due to living in [place].  This RN informed her the cancer center has a satellite office on [address] that we can use for labs - if care is needed at their office- it is best to be seen by the attending MD for best outcome.  Per phone discussion- pt asked about use of other [facility] clinics to obtain labs and received IVF if needed due to length of travel from her home.  This RN informed her unfortunately we do not do IVF other then in our cancer clinics.  This RN did state that some patients who live remotely arrange with their primary MD to have labs checked.  This RN discussed with pt above issue which may may a difference in which facility she obtains her cancer care especially if she needed frequent infusions and lab monitoring .   Cory will think about the above and discuss further with MD at visit next week.

## 2020-04-25 ENCOUNTER — Other Ambulatory Visit: Payer: Self-pay | Admitting: Oncology

## 2020-04-28 ENCOUNTER — Telehealth: Payer: Self-pay | Admitting: *Deleted

## 2020-04-28 ENCOUNTER — Other Ambulatory Visit: Payer: Self-pay | Admitting: *Deleted

## 2020-04-28 ENCOUNTER — Other Ambulatory Visit: Payer: Self-pay

## 2020-04-28 ENCOUNTER — Inpatient Hospital Stay: Payer: Medicare HMO

## 2020-04-28 ENCOUNTER — Ambulatory Visit (HOSPITAL_COMMUNITY)
Admission: RE | Admit: 2020-04-28 | Discharge: 2020-04-28 | Disposition: A | Discharge status: Home / Self Care | Payer: Medicare HMO | Source: Ambulatory Visit | Attending: Oncology | Admitting: Oncology

## 2020-04-28 ENCOUNTER — Other Ambulatory Visit: Payer: Self-pay | Admitting: Oncology

## 2020-04-28 DIAGNOSIS — D696 Thrombocytopenia, unspecified: Secondary | ICD-10-CM | POA: Diagnosis present

## 2020-04-28 DIAGNOSIS — C50812 Malignant neoplasm of overlapping sites of left female breast: Secondary | ICD-10-CM | POA: Insufficient documentation

## 2020-04-28 DIAGNOSIS — I700 Atherosclerosis of aorta: Secondary | ICD-10-CM | POA: Insufficient documentation

## 2020-04-28 DIAGNOSIS — C7951 Secondary malignant neoplasm of bone: Secondary | ICD-10-CM

## 2020-04-28 DIAGNOSIS — E860 Dehydration: Secondary | ICD-10-CM

## 2020-04-28 DIAGNOSIS — Z170 Estrogen receptor positive status [ER+]: Secondary | ICD-10-CM | POA: Insufficient documentation

## 2020-04-28 DIAGNOSIS — J438 Other emphysema: Secondary | ICD-10-CM | POA: Diagnosis present

## 2020-04-28 DIAGNOSIS — C50912 Malignant neoplasm of unspecified site of left female breast: Secondary | ICD-10-CM | POA: Insufficient documentation

## 2020-04-28 DIAGNOSIS — Z5111 Encounter for antineoplastic chemotherapy: Secondary | ICD-10-CM | POA: Diagnosis not present

## 2020-04-28 MED ORDER — FLUOROESTRADIOL F 18 4-100 MCI/ML IV SOLN
6.0000 | Freq: Once | INTRAVENOUS | Status: AC
  Administered 2020-04-28: 6.2 via INTRAVENOUS

## 2020-04-28 MED ORDER — PROMETHAZINE HCL 25 MG RE SUPP: 25.0000 mg | Freq: Four times a day (QID) | RECTAL | 3 refills | Status: AC | PRN

## 2020-04-28 MED ORDER — SODIUM CHLORIDE 0.9 % IV SOLN
INTRAVENOUS | Status: AC
  Filled 2020-04-28 (×2): qty 250

## 2020-04-28 MED ORDER — PALBOCICLIB 125 MG PO CAPS: 125.0000 mg | ORAL_CAPSULE | Freq: Every day | ORAL | 6 refills | Status: DC

## 2020-04-28 NOTE — Progress Notes (Signed)
Jamie Hartman Cerianna scan is strongly positive in the bones indicating the tumor is indeed estrogen receptor avid.  The triple negative relief from the biopsy obtained at [facility] must of been artifactual secondary to decalcification.  Accordingly she is stopping anastrozole and she is starting fulvestrant and palbociclib 05/01/2020.  Unfortunately uptake in the normal liver was too strong and we were not able to determine whether she does or does not have measurable cancer lesions in the liver.  Accordingly she needs a liver MRI which is scheduled for 05/06/2020 and I have asked our billing people to help get that approved

## 2020-04-28 NOTE — Telephone Encounter (Signed)
This RN spoke with pt husband, Wille Glaser, who had left a VM stating need for IVF today ( they are coming in for PET scan ) - which he said " they could do when she has the PET "  Clarified with him - above was misunderstanding - but they needed to come directly here after the PET and get IVF ( arranged per discussion with infusion room ).  Joe verbalized understanding.  MD will see the pt in the chemo room.

## 2020-04-29 ENCOUNTER — Telehealth: Payer: Self-pay | Admitting: Pharmacist

## 2020-04-29 ENCOUNTER — Telehealth: Payer: Self-pay

## 2020-04-29 DIAGNOSIS — C50912 Malignant neoplasm of unspecified site of left female breast: Secondary | ICD-10-CM

## 2020-04-29 NOTE — Telephone Encounter (Signed)
Oral Chemotherapy Pharmacist Encounter   Patient found to have ER positive disease. MD no longer plans to proceed with Xeloda.  Darl Pikes, PharmD, BCPS, BCOP, CPP Hematology/Oncology Clinical Pharmacist [facility] [place] [facility] [phone]  04/29/2020 3:34 PM

## 2020-04-29 NOTE — Telephone Encounter (Signed)
Oral Oncology Patient Advocate Encounter  Prior Authorization for Jamie Hartman has been approved.    PA# [id number] Effective dates: 12/07/19 through 12/05/20  Patients co-pay is $2792.92  Oral Oncology Clinic will continue to follow.   [place] Patient [place] Phone [phone] Fax [phone] 04/29/2020 4:09 PM

## 2020-04-29 NOTE — Telephone Encounter (Signed)
Oral Oncology Hartman Advocate Encounter  Received notification from [place] that prior authorization for Leslee Home is required.  PA submitted on CoverMyMeds Key [id number] Status is pending  Oral Oncology Clinic will continue to follow.  Jamie Hartman [place] Phone [phone] Fax [phone] 04/29/2020 3:41 PM

## 2020-04-29 NOTE — Telephone Encounter (Signed)
Oral Oncology Pharmacist Encounter  Received new prescription for Ibrance (palbociclib) for the treatment of metastatic breast cancer ER positive in conjunction with fulvestrant, planned duration until disease progression or unacceptable drug toxicity.  CMP from 04/17/20 assessed, AST/ALT elevated but t.bili wnl. If patient was Child-Pugh class C her dose would need to be reduced to 75mg  dose. Recommend continued liver function monitoring. Patient scheduled for repeat labs on 05/01/20. Prescription dose and frequency assessed.   Current medication list in Epic reviewed, no DDIs with palbociclib identified.  Prescription has been e-scribed to the [facility] for benefits analysis and approval.  Oral Oncology Clinic will continue to follow for insurance authorization, copayment issues, initial counseling and start date.  Darl Pikes, PharmD, BCPS, BCOP, CPP Hematology/Oncology Clinical Pharmacist Practitioner [facility] [place] [facility] [phone]  04/29/2020 3:38 PM

## 2020-04-30 ENCOUNTER — Encounter: Payer: Self-pay | Admitting: Oncology

## 2020-04-30 NOTE — Progress Notes (Signed)
Jamie Hartman  [phone] Fax:([phone]) [phone]     ID: Jamie Hartman DOB: June 18, 1961  MR#: [id number]  QQP#:[id number]  Patient Care Team: Edmonia James, PA-C as PCP - General (Physician Assistant) Beverely Pace, MD as Referring Physician (Surgical Oncology) Linsi Humann, Virgie Dad, MD as Consulting Physician (Oncology) Chauncey Cruel, MD OTHER MD:  CHIEF COMPLAINT: Invasive lobular breast cancer, stage IV, triple negative  CURRENT TREATMENT: Fulvestrant and palbociclib; zoledronate   INTERVAL HISTORY: Jamie Hartman returns today for follow up of her triple negative breast cancer accompanied by her husband Jamie Hartman. She was evaluated in the breast cancer clinic 04/17/2020.  Since consultation, she underwent [place] PET scan on 04/28/2020 showing: intense multifocal estrogen positive breast cancer within axillary and appendicular skeleton; while there is diffuse sclerotic metastasis throughout the spine, half of vertebral bodies have intense radiotracer activity; intense radiotracer activity associated with small mediastinal and right hilar lymph nodes, difficult to define by CT imaging; indeterminate left cerebral hemisphere lesiont; liver cannot be well assessed; no other visceral metastasis identified.  She is having significant nausea and headaches and I think a brain MRI will be needed to clarify the issue.  She will start fulvestrant and palbociclib today.  She will resume zoledronate 05/15/2020  She is scheduled for liver MRI on 05/06/2020.   REVIEW OF SYSTEMS: Bushra has no appetite and is nauseated.  This is very frustrating to her husband who sees her "wasting away" and being very weak, not able to do much at all.  We did prescribe Phenergan suppositories and she has taken it twice at night.  It has helped her nausea then and has slept her sleep however she has not for some reason taking them during the day.  We discussed that extensively.  She is however hydrating  herself well.  She is making her own rehydration solution also drinking quite a bit of Gatorade and some water with lemon.  She is easily drinking 2 quarts daily at this point.  She has pain low in the stomach, like gas.  It is intermittent.  They are both very concerned regarding the cost of medications, for example the Phenergan was about $100 for 12 doses.  They may need financial assistance.  I have suggested they discussed this with our billing advocates   HISTORY OF CURRENT ILLNESS: From the original intake note:  Jamie Hartman 's breast cancer history dates back to February 2018 when she noted left nipple retraction.  Her subsequent evaluation and treatment is detailed below, but basically she received neoadjuvant chemotherapy with docetaxel doxorubicin and cyclophosphamide and proceeded to mastectomy which unfortunately showed T3 N2 residual disease, with all 10 axillary lymph nodes removed positive.  She received adjuvant radiation and then was started on anastrozole.  More recently the had a CT scan of the chest with contrast to evaluate her ongoing left-sided chest wall pain.  This showed several new sclerotic lesions in the thoracic vertebrae but no visceral disease.  PET scan 02/18/2020 showed again no visceral uptake but widespread bony metastatic disease through the axial and appendicular skeleton.  Thoracic and lumbar spine MRI confirmed multiple areas of metastatic disease to bone without evidence of neural impingement.  On 03/28/2020 the patient underwent CT-guided biopsy of the right iliac bone, and this showed ([id number] [phone]) metastatic carcinoma consistent with lobular breast cancer, but now triple negative, with an MIB-1 of 20%.  Her subsequent history is as detailed below   PAST MEDICAL HISTORY: Past Medical History:  Diagnosis  Date  . Breast cancer ([place]) 01/2017   metastatic lobular carcinoma    PAST SURGICAL HISTORY: Past Surgical History:  Procedure Laterality Date  .  BILATERAL TOTAL MASTECTOMY WITH AXILLARY LYMPH NODE DISSECTION  09/04/2017  . LEEP      FAMILY HISTORY: Family History  Problem Relation Age of Onset  . Emphysema Mother   . Diabetes Father   . Breast cancer Maternal Aunt   . Breast cancer Paternal Aunt   . Pancreatic cancer Maternal Grandmother   The patient's father died at 16 from complications of diabetes.  He had a sister diagnosed with breast cancer.  The patient's mother died at 52 from complications of emphysema.  Her mother, the patient's maternal grandmother, had pancreatic cancer and a maternal aunt had a history of breast cancer.  The patient herself has a brother, with no history of cancer.  She has no sisters.   GYNECOLOGIC HISTORY:  No LMP recorded. Patient is postmenopausal. Menarche: 59 years old GX P 0 LMP 50 HRT no  Hysterectomy?  No BSO?  No   SOCIAL HISTORY: (updated May 2021)  Jamie Hartman lives with her husband Jamie Hartman and 4 cats.  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    ADVANCED DIRECTIVES: In the absence of any document to the contrary the patient's husband is her healthcare power of attorney.   HEALTH MAINTENANCE: Social History   Tobacco Use  . Smoking status: Current Every Day Smoker    Packs/day: 0.50    Types: Cigarettes  . Smokeless tobacco: Never Used  Substance Use Topics  . Alcohol use: Yes  . Drug use: Not on file     Colonoscopy: 2020  PAP: Up-to-date  Bone density: 2019   Allergies  Allergen Reactions  . Pantoprazole Nausea Only, Other (See Comments) and Shortness Of Breath    Leg cramps, headaches Leg cramps, headaches Leg cramps, headaches Leg cramps, headaches   . Adhesive  [Tape] Dermatitis    Band aids  . Avelox [Moxifloxacin Hcl In Nacl]     Severe headache  . Ciprofloxacin Rash    Current Outpatient Medications  Medication Sig Dispense Refill  . ALPRAZolam (XANAX) 0.5 MG tablet Take by mouth.    . ondansetron (ZOFRAN-ODT) 8 MG disintegrating tablet Take by mouth.      . palbociclib (IBRANCE) 125 MG tablet Take 1 tablet (125 mg total) by mouth daily. Take for 21 days on, 7 days off, repeat every 28 days. 21 tablet 0  . prochlorperazine (COMPAZINE) 10 MG tablet Take by mouth.    . promethazine (PHENERGAN) 25 MG suppository Place 1 suppository (25 mg total) rectally every 6 (six) hours as needed for nausea or vomiting. 12 each 3  . valACYclovir (VALTREX) 500 MG tablet 1 tablet bid x 3 days    . VITAMIN D, ERGOCALCIFEROL, PO Take 50,000 Units by mouth. 1 po q 2 weeks    . Zoledronic Acid (ZOMETA) 4 MG/100ML IVPB Inject into the vein.     No current facility-administered medications for this visit.    OBJECTIVE: White woman examined in a wheelchair  Vitals:   05/01/20 1534  BP: 104/63  Pulse: (!) 113  Resp: 18  Temp: 97.7 F (36.5 C)  SpO2: 100%     Body mass index is 22.35 kg/m.   Wt Readings from Last 3 Encounters:  05/01/20 122 lb 3.2 oz (55.4 kg)  04/17/20 128 lb 1.6 oz (58.1 kg)  08/21/19 130 lb (59 kg)  ECOG FS:2 - Symptomatic, <50% confined to bed  Sclerae unicteric, EOMs intact Wearing a mask No cervical or supraclavicular adenopathy Lungs no rales or rhonchi Heart regular rate and rhythm Abd soft, positive bowel sounds, no masses palpated MSK no focal spinal tenderness, no upper extremity lymphedema Neuro: nonfocal, well oriented, appropriate affect Breasts: Deferred   LAB RESULTS:  CMP     Component Value Date/Time   NA 142 04/17/2020 1521   K 4.5 04/17/2020 1521   CL 105 04/17/2020 1521   CO2 24 04/17/2020 1521   GLUCOSE 93 04/17/2020 1521   BUN 16 04/17/2020 1521   CREATININE 0.99 04/17/2020 1521   CALCIUM 10.3 04/17/2020 1521   PROT 7.1 04/17/2020 1521   ALBUMIN 3.6 04/17/2020 1521   AST 227 (HH) 04/17/2020 1521   ALT 91 (H) 04/17/2020 1521   ALKPHOS 180 (H) 04/17/2020 1521   BILITOT 0.7 04/17/2020 1521   GFRNONAA >60 04/17/2020 1521   GFRAA >60 04/17/2020 1521    No results found for: TOTALPROTELP,  ALBUMINELP, A1GS, A2GS, BETS, BETA2SER, GAMS, MSPIKE, SPEI  Lab Results  Component Value Date   WBC 10.4 05/01/2020   NEUTROABS 2.7 05/01/2020   HGB 9.3 (L) 05/01/2020   HCT 28.9 (L) 05/01/2020   MCV 87.8 05/01/2020   PLT 13 (L) 05/01/2020    No results found for: LABCA2  No components found for: [id number]  No results for input(s): INR in the last 168 hours.  No results found for: LABCA2  No results found for: GYB638  No results found for: LHT342  No results found for: AJG811  No results found for: CA2729  No components found for: HGQUANT  No results found for: CEA1 / No results found for: CEA1   No results found for: AFPTUMOR  No results found for: CHROMOGRNA  No results found for: KPAFRELGTCHN, LAMBDASER, KAPLAMBRATIO (kappa/lambda light chains)  No results found for: HGBA, HGBA2QUANT, HGBFQUANT, HGBSQUAN (Hemoglobinopathy evaluation)   No results found for: LDH  No results found for: IRON, TIBC, IRONPCTSAT (Iron and TIBC)  No results found for: FERRITIN  Urinalysis    Component Value Date/Time   BILIRUBINUR negative 03/04/2016 1453   KETONESUR trace (5) (A) 03/04/2016 1453   PROTEINUR negative 03/04/2016 1453   UROBILINOGEN 0.2 03/04/2016 1453   NITRITE Negative 03/04/2016 1453   LEUKOCYTESUR Negative 03/04/2016 1453     STUDIES: NM PET (CERIANNA) WHOLE BODY  Result Date: 04/28/2020 CLINICAL DATA:  Metastatic breast carcinoma. Bone metastasis on FDG PET scan. Lobular carcinoma status post mastectomies chemotherapy and adjuvant therapy. T3 disease with nodal metastasis. Initial ER positive pathology. Subsequent biopsy suggest triple negative phenotype. EXAM: NUCLEAR MEDICINE PET WHOLE BODY TECHNIQUE: 5.7 mCi F-18 Estradiol was injected intravenously. Full-ring PET imaging was performed from the vertex to feet after the radiotracer. CT data was obtained and used for attenuation correction and anatomic localization. COMPARISON:  FDG PET scan 02/18/2020,  CT 02/21/2020 FINDINGS: Mediastinal blood pool activity: 3.7 HEAD/NECK: Focus of intense uptake at the LEFT posterior vertex is favored within the skull. However, more inferiorly in the LEFT cerebral parenchyma medial to the ventricular atria there is a focus of moderate radiotracer activity with SUV max equal 5.1. (Image 15 of fused data set) Incidental CT findings: None CHEST: There is intense activity associated with small mediastinal lymph nodes including subcarinal lymph nodes, prevascular lymph nodes and RIGHT hilar lymph node. These lymph nodes are small and difficult define on CT. Subcarinal activity is intense with SUV max equal 14.3.  RIGHT hilar activity also intense with SUV max equal 14.8. There is mild activity associated with pleuroparenchymal thickening in the LEFT upper lobe at radiation treatment site. Incidental CT findings: Small LEFT effusion. ABDOMEN/PELVIS: No clear abnormal activity liver however intense background activity within liver limits sensitivity for liver metastasis. No hypermetabolic abdominopelvic lymph nodes. No evidence of visceral metastasis otherwise. Incidental CT findings: Post hysterectomy.  No adnexal abnormality. SKELETON: The dominant finding is multifocal intense radiotracer activity within the skeleton. Example lesion in the RIGHT iliac bone adjacent to SI joint with SUV max equal 16.4. Example lesion in the L2 vertebral body with SUV max equal 18.9. While nearly every vertebral body has sclerotic metastasis approximately half of the vertebral bodies have intense radiotracer activity. For example lesion at L2 with SUV max equal 18.9. Lesion at T10 with SUV max equal 22.2. Lesions involve the appendicular skeleton including the RIGHT femoral head with SUV max equal 14.8. Intense lesion in the RIGHT scapula adjacent the glenoid fossa with SUV max equal 20.8. Multiple foci at the skull base and cervical spine. Incidental CT findings: No pathologic fracture. Diffuse  sclerotic metastasis EXTREMITIES: Proximal appendicular skeleton metastasis as above Incidental CT findings: None IMPRESSION: 1. Dominant finding is intense multifocal estrogen positive breast cancer within the axillary and appendicular skeleton. While there is diffuse sclerotic metastasis throughout the spine, approximately half of the vertebral bodies have intense radiotracer activity. 2. Intense radiotracer activity associated with small mediastinal and RIGHT hilar lymph nodes is concerning for estrogen positive breast cancer in the mediastinum. These lesions are very small and difficult to define by CT imaging. 3. Indeterminate lesion within the LEFT cerebral hemisphere. Recommend MRI brain with and without contrast for further evaluation. Calvarial metastasis noted which could influence the above findings. 4. No visceral metastasis identified (other than the indeterminate lesion in the brain). Of note, sensitivity is reduced in liver due to intense background estrogen receptor positivity. Electronically Signed   By: Suzy Bouchard M.D.   On: 04/28/2020 17:25   This is additional information from Dr. Ilda Foil regarding the recent estradiol F-18 scan: Shared this Estradiol PET scan with the company medical imaging expert.  He agreed with our major findings of E2 positive disease in the bones and mediastinum. He felt the "brain" uptake was probably a skull lesion with motion artifact. I did mention in my report that the brain activity may be related to adjacent positive skull lesion.   Interestingly, the activity associated with the left upper lobe radiation change is a know but poorly understood association of radiotracer uptake and radiation fibrosis (not inflammation). I had down played this finding in the report as I suspected an explanation along these lines.   Call with questions.   Jamie Hartman     ELIGIBLE FOR AVAILABLE RESEARCH PROTOCOL: To be determined  ASSESSMENT: 59 y.o. [place]  woman with metastatic lobular breast cancer as follows:  (1) status post left breast overlapping and axillary lymph node biopsy 01/26/2017 for a clinical mT2 N1, stage IIA invasive lobular carcinoma, strongly estrogen and progesterone receptor positive, HER-2 not amplified  (2) treated neoadjuvantly with docetaxel, doxorubicin and cyclophosphamide for 6 cycles, between 03/10/2017 and 07/01/2017  (3) status post bilateral mastectomies 08/25/2017 showing residual invasive lobular carcinoma, ypT3 ypN3, stage IIIB  (4) status post adjuvant radiation to the left breast and axilla 11/30/2017 through 01/10/2018.  (5) anastrozole started 09/22/2017  METASTATIC DISEASE (6) CT scan of the chest 02/11/2020, PET scan 02/18/2020 and MRI of the thoracolumbar spine  03/12/2020 show multiple bone lesions (axial and appendicular) without neural impingement and without evidence of visceral disease  (a) CT-guided right iliac crest biopsy 03/28/2020 confirms metastatic invasive lobular breast cancer, now triple negative, with an MIB-1 of 20%  (b) F-18 estradiol "cerinanna" PET scan 04/28/2020 shows stron estrogen avidity in bone lesions, questionable brain lesion, does not resolve issue of possible liver lesions  (c) liver MRI 05/06/2020  (b) brain MRI  (7) palbociclib and fulvestrant started 05/01/2020  (8) zoledronate to start 05/15/2020, to be repeated every 12 weeks   PLAN: I discussed results of the F-18 estradiol scan with Jamie Hartman and Jamie Hartman.  They understand it shows avid estrogen uptake and this is convincing for continuing estrogen sensitivity of the tumor despite the earlier bone marrow results suggesting the tumor had become triple negative.  It is possible the decalcification procedure is responsible for that discrepancy.  In any case she has significant estrogen receptor positive disease and we are addressing that.  She has a good understanding of the possible toxicities side effects and complications  of palbociclib and fulvestrant and will be starting today.  She is significantly thrombocytopenic.  We are proceeding with a unit of platelets today.  We will repeat her lab work when she returns in 2 weeks for her second dose of fulvestrant and she may need for further transfusions at that time.  Jamie Hartman is very frustrated that Jamie Hartman is not better and in his eyes does not seem to be helping herself.  I think she is doing the best she can but it is very debilitating to have uncontrolled metastatic cancer.  I think within 2 to 3 months if we can get through the initial treatments she will start feeling better, go back to walking and perhaps even running and doing yoga as she did before.  I did encourage her to take her Phenergan 2-3 times daily.  I will see if we can have our financial advisor meet with Jamie Hartman when Mahina returns in 2 weeks for her next fulvestrant dose.  Total encounter time 60 minutes.Jamie Jews C. Edgardo Petrenko, MD 05/01/2020 4:15 PM Medical Oncology and Hematology [facility] [place], [place] [address] Tel. [phone]    Fax. [phone]   This document serves as a record of services personally performed by Lurline Del, MD. It was created on his behalf by Wilburn Mylar, a trained medical scribe. The creation of this record is based on the scribe's personal observations and the provider's statements to them.   I, Lurline Del MD, have reviewed the above documentation for accuracy and completeness, and I agree with the above.   *Total Encounter Time as defined by the Centers for Medicare and Medicaid Services includes, in addition to the face-to-face time of a patient visit (documented in the note above) non-face-to-face time: obtaining and reviewing outside history, ordering and reviewing medications, tests or procedures, care coordination (communications with other health care professionals or caregivers) and documentation in the medical  record.

## 2020-05-01 ENCOUNTER — Inpatient Hospital Stay: Payer: Medicare HMO

## 2020-05-01 ENCOUNTER — Other Ambulatory Visit: Payer: Self-pay

## 2020-05-01 ENCOUNTER — Inpatient Hospital Stay (HOSPITAL_BASED_OUTPATIENT_CLINIC_OR_DEPARTMENT_OTHER): Payer: Medicare HMO | Admitting: Oncology

## 2020-05-01 VITALS — BP 104/63 | HR 113 | Temp 97.7°F | Resp 18 | Ht 62.0 in | Wt 122.2 lb

## 2020-05-01 VITALS — BP 96/49 | HR 96 | Temp 98.0°F | Resp 18

## 2020-05-01 DIAGNOSIS — C7951 Secondary malignant neoplasm of bone: Secondary | ICD-10-CM | POA: Diagnosis not present

## 2020-05-01 DIAGNOSIS — D696 Thrombocytopenia, unspecified: Secondary | ICD-10-CM

## 2020-05-01 DIAGNOSIS — C50912 Malignant neoplasm of unspecified site of left female breast: Secondary | ICD-10-CM | POA: Diagnosis not present

## 2020-05-01 DIAGNOSIS — C50812 Malignant neoplasm of overlapping sites of left female breast: Secondary | ICD-10-CM | POA: Diagnosis not present

## 2020-05-01 DIAGNOSIS — Z170 Estrogen receptor positive status [ER+]: Secondary | ICD-10-CM

## 2020-05-01 DIAGNOSIS — Z7189 Other specified counseling: Secondary | ICD-10-CM | POA: Diagnosis not present

## 2020-05-01 DIAGNOSIS — J438 Other emphysema: Secondary | ICD-10-CM

## 2020-05-01 DIAGNOSIS — Z5111 Encounter for antineoplastic chemotherapy: Secondary | ICD-10-CM | POA: Diagnosis not present

## 2020-05-01 DIAGNOSIS — I700 Atherosclerosis of aorta: Secondary | ICD-10-CM

## 2020-05-01 LAB — CBC WITH DIFFERENTIAL/PLATELET
Abs Immature Granulocytes: 0.64 10*3/uL — ABNORMAL HIGH (ref 0.00–0.07)
Basophils Absolute: 0.2 10*3/uL — ABNORMAL HIGH (ref 0.0–0.1)
Basophils Relative: 2 %
Eosinophils Absolute: 0.2 10*3/uL (ref 0.0–0.5)
Eosinophils Relative: 2 %
HCT: 28.9 % — ABNORMAL LOW (ref 36.0–46.0)
Hemoglobin: 9.3 g/dL — ABNORMAL LOW (ref 12.0–15.0)
Immature Granulocytes: 6 %
Lymphocytes Relative: 58 %
Lymphs Abs: 6.1 10*3/uL — ABNORMAL HIGH (ref 0.7–4.0)
MCH: 28.3 pg (ref 26.0–34.0)
MCHC: 32.2 g/dL (ref 30.0–36.0)
MCV: 87.8 fL (ref 80.0–100.0)
Monocytes Absolute: 0.6 10*3/uL (ref 0.1–1.0)
Monocytes Relative: 6 %
Neutro Abs: 2.7 10*3/uL (ref 1.7–7.7)
Neutrophils Relative %: 26 %
Platelets: 13 10*3/uL — ABNORMAL LOW (ref 150–400)
RBC: 3.29 MIL/uL — ABNORMAL LOW (ref 3.87–5.11)
RDW: 19.3 % — ABNORMAL HIGH (ref 11.5–15.5)
WBC: 10.4 10*3/uL (ref 4.0–10.5)
nRBC: 18.4 % — ABNORMAL HIGH (ref 0.0–0.2)

## 2020-05-01 LAB — CMP (CANCER CENTER ONLY)
ALT: 326 U/L (ref 0–44)
AST: 561 U/L (ref 15–41)
Albumin: 3.4 g/dL — ABNORMAL LOW (ref 3.5–5.0)
Alkaline Phosphatase: 189 U/L — ABNORMAL HIGH (ref 38–126)
Anion gap: 11 (ref 5–15)
BUN: 13 mg/dL (ref 6–20)
CO2: 28 mmol/L (ref 22–32)
Calcium: 9.3 mg/dL (ref 8.9–10.3)
Chloride: 107 mmol/L (ref 98–111)
Creatinine: 0.9 mg/dL (ref 0.44–1.00)
GFR, Est AFR Am: >60 mL/min (ref >60)
GFR, Estimated: >60 mL/min (ref >60)
Glucose, Bld: 93 mg/dL (ref 70–99)
Potassium: 4.2 mmol/L (ref 3.5–5.1)
Sodium: 146 mmol/L — ABNORMAL HIGH (ref 135–145)
Total Bilirubin: 0.8 mg/dL (ref 0.3–1.2)
Total Protein: 6.3 g/dL — ABNORMAL LOW (ref 6.5–8.1)

## 2020-05-01 LAB — ABO/RH: ABO/RH(D): O POS

## 2020-05-01 MED ORDER — ACETAMINOPHEN 325 MG PO TABS
ORAL_TABLET | ORAL | Status: AC
  Filled 2020-05-01: qty 2

## 2020-05-01 MED ORDER — DIPHENHYDRAMINE HCL 25 MG PO CAPS
ORAL_CAPSULE | ORAL | Status: AC
  Filled 2020-05-01: qty 1

## 2020-05-01 MED ORDER — PALBOCICLIB 125 MG PO TABS: 125.0000 mg | ORAL_TABLET | Freq: Every day | ORAL | 0 refills | Status: DC

## 2020-05-01 MED ORDER — FULVESTRANT 250 MG/5ML IM SOLN
500.0000 mg | Freq: Once | INTRAMUSCULAR | Status: AC
  Administered 2020-05-01: 500 mg via INTRAMUSCULAR

## 2020-05-01 MED ORDER — FULVESTRANT 250 MG/5ML IM SOLN
INTRAMUSCULAR | Status: AC
  Filled 2020-05-01: qty 10

## 2020-05-01 MED ORDER — SODIUM CHLORIDE 0.9% FLUSH
10.0000 mL | INTRAVENOUS | Status: DC | PRN
  Filled 2020-05-01: qty 10

## 2020-05-01 MED ORDER — ACETAMINOPHEN 325 MG PO TABS
650.0000 mg | ORAL_TABLET | Freq: Once | ORAL | Status: AC
  Administered 2020-05-01: 650 mg via ORAL

## 2020-05-01 MED ORDER — SODIUM CHLORIDE 0.9% IV SOLUTION
250.0000 mL | Freq: Once | INTRAVENOUS | Status: AC
  Administered 2020-05-01: 250 mL via INTRAVENOUS
  Filled 2020-05-01: qty 250

## 2020-05-01 MED ORDER — HEPARIN SOD (PORK) LOCK FLUSH 100 UNIT/ML IV SOLN
250.0000 [IU] | INTRAVENOUS | Status: DC | PRN
  Filled 2020-05-01: qty 5

## 2020-05-01 MED ORDER — DIPHENHYDRAMINE HCL 25 MG PO CAPS
25.0000 mg | ORAL_CAPSULE | Freq: Once | ORAL | Status: AC
  Administered 2020-05-01: 25 mg via ORAL

## 2020-05-01 MED FILL — IBRANCE 125 MG TABS: 125 | 28 days supply | Qty: 21 | Fill #0

## 2020-05-01 NOTE — Progress Notes (Signed)
Received critical lab values from [facility] in lab and Dr. [name] aware of liver enzyme results - NO new orders at this time.

## 2020-05-01 NOTE — Patient Instructions (Signed)

## 2020-05-01 NOTE — Telephone Encounter (Signed)
Oral Chemotherapy Pharmacist Encounter  Jamie Hartman will be filled at [facility] using a one time voucher. Patient will proceed with manufacturer assistance application for long term medication access.  Patient Education I spoke with patient for overview of new oral chemotherapy medication: Ibrance (palbociclib) for the treatment of metastatic breast cancer ER positive in conjunction with fulvestrant, planned duration until disease progression or unacceptable drug toxicity.   Counseled patient on administration, dosing, side effects, monitoring, drug-food interactions, safe handling, storage, and disposal. Patient will take 1 tablet (125 mg total) by mouth daily. Take for 21 days on, 7 days off, repeat every 28 days.  Side effects include but not limited to: decreased wbc, fatigue, N/V, hair thinning.    Reviewed with patient importance of keeping a medication schedule and plan for any missed doses.  [name] voiced understanding and appreciation. All questions answered. Medication handout provided in clinic.  Provided patient with [facility] phone number. Patient knows to call the office with questions or concerns. Oral Chemotherapy Navigation Clinic will continue to follow.  Darl Pikes, PharmD, BCPS, BCOP, CPP Hematology/Oncology Clinical Pharmacist Practitioner [facility] [phone]  05/01/2020 11:04 AM

## 2020-05-02 ENCOUNTER — Telehealth: Payer: Self-pay | Admitting: Oncology

## 2020-05-02 LAB — PREPARE PLATELET PHERESIS: Unit division: 0

## 2020-05-02 LAB — BPAM PLATELET PHERESIS
Blood Product Expiration Date: 202105272359
ISSUE DATE / TIME: 202105271742
Unit Type and Rh: 8400

## 2020-05-02 NOTE — Telephone Encounter (Signed)
Scheduled appts per 5/27 los. Left voicemail with appt date and time.

## 2020-05-03 LAB — CANCER ANTIGEN 15-3: CA 15-3: 9870 U/mL — ABNORMAL HIGH (ref 0.0–25.0)

## 2020-05-06 ENCOUNTER — Ambulatory Visit (HOSPITAL_COMMUNITY): Payer: Medicare HMO

## 2020-05-06 ENCOUNTER — Other Ambulatory Visit: Payer: Self-pay | Admitting: *Deleted

## 2020-05-07 ENCOUNTER — Telehealth: Payer: Self-pay | Admitting: *Deleted

## 2020-05-07 NOTE — Telephone Encounter (Signed)
This RN spoke with Jamie Hartman per his call this AM stating pt has not had a BM since last week.  She is passing gas.  She has not taken anything to assist with BM's.  This RN discussed use of miralax post verifying how well the patient is hydrating ( he states she is drinking very well )   They will institute the above and call if further needs.

## 2020-05-08 ENCOUNTER — Telehealth: Payer: Self-pay

## 2020-05-08 NOTE — Telephone Encounter (Signed)
Oral Oncology Patient Advocate Encounter  Met patient in [place] to complete application for [employer] in an effort to reduce patient's out of pocket expense for Ibrance to $0.    Application completed and faxed to [phone].   [employer] patient assistance phone number for follow up is [phone].   This encounter will be updated until final determination.   [place] Patient [place] Phone [phone] Fax [phone] 05/08/2020 4:04 PM

## 2020-05-09 ENCOUNTER — Telehealth: Payer: Self-pay | Admitting: *Deleted

## 2020-05-09 MED ORDER — HYDROXYZINE HCL 25 MG PO TABS: 25.0000 mg | ORAL_TABLET | Freq: Every evening | ORAL | 0 refills | Status: DC | PRN

## 2020-05-09 NOTE — Telephone Encounter (Signed)
Husband - Wille Glaser- called to request " something for her to sleep ".  Prescription for hydroxyzine obtained and sent to her pharmacy.  Joe made aware of above.

## 2020-05-13 NOTE — Telephone Encounter (Signed)
Patient is approved for Ibrance at no charge through [employer] 05/09/20-12/05/20.  [place] phone for follow up [place] Patient [place] Phone [phone] Fax [phone] 05/13/2020 8:14 AM

## 2020-05-14 ENCOUNTER — Other Ambulatory Visit: Payer: Self-pay | Admitting: Emergency Medicine

## 2020-05-14 DIAGNOSIS — Z7189 Other specified counseling: Secondary | ICD-10-CM

## 2020-05-14 DIAGNOSIS — C7951 Secondary malignant neoplasm of bone: Secondary | ICD-10-CM

## 2020-05-14 DIAGNOSIS — C50812 Malignant neoplasm of overlapping sites of left female breast: Secondary | ICD-10-CM

## 2020-05-14 DIAGNOSIS — C50912 Malignant neoplasm of unspecified site of left female breast: Secondary | ICD-10-CM

## 2020-05-14 DIAGNOSIS — Z170 Estrogen receptor positive status [ER+]: Secondary | ICD-10-CM

## 2020-05-14 DIAGNOSIS — D696 Thrombocytopenia, unspecified: Secondary | ICD-10-CM

## 2020-05-14 NOTE — Progress Notes (Addendum)
Jamie Hartman   [phone] [phone]   Clinic Follow up Note   Patient Care Team: Edmonia James, PA-C as PCP - General (Physician Assistant) Beverely Pace, MD as Referring Physician (Surgical Oncology) Magrinat, Virgie Dad, MD as Consulting Physician (Oncology) 05/15/2020  CHIEF COMPLAINT: F/u metastatic lobular carcinoma, triple negative   CURRENT THERAPY: Fulvestrant and palbociclin; zometa    INTERVAL HISTORY: Jamie Hartman returns for follow up and second dose Fulvestrant as scheduled. She is not feeling well. Appetite remains poor, husband reports she has not had a decent meal in 6 weeks. She snacks on a nutrition bar during the day. She does drink fluids constantly but mouth is dry, no sores. She has some gum bleeding and small nose bleeds she attributes to dry air at home and dry mouth. Denies rectal bleeding or hematuria. She is weak. Has dry cough and dyspnea on minimal exertion which appear to be at baseline. Sits down for her shower. Her husband is very attentive and helpful but still has to work some to supplement income. They are interested in help at home. She has intermittent nausea and dry heaves. She questions if this is somewhat related to anxiety. She takes 1/2 phenergan suppository which makes her drowsy. She prefers this to zofran ODT or compazine. Bowels moving now. She feels depressed due to her condition. Has not started hydroxyzine for sleep. Otherwise, denies fever, chills, cough, chest pain, leg edema, rash, or pain.    MEDICAL HISTORY:  Past Medical History:  Diagnosis Date  . Breast cancer ([place]) 01/2017   metastatic lobular carcinoma    SURGICAL HISTORY: Past Surgical History:  Procedure Laterality Date  . BILATERAL TOTAL MASTECTOMY WITH AXILLARY LYMPH NODE DISSECTION  09/04/2017  . LEEP      I have reviewed the social history and family history with the patient and they are unchanged from previous note.  ALLERGIES:  is  allergic to pantoprazole, adhesive  [tape], avelox [moxifloxacin hcl in nacl], and ciprofloxacin.  MEDICATIONS:  Current Outpatient Medications  Medication Sig Dispense Refill  . cholecalciferol (VITAMIN D3) 25 MCG (1000 UNIT) tablet Take 1 tablet (1,000 Units total) by mouth daily.    . palbociclib (IBRANCE) 125 MG tablet Take 1 tablet (125 mg total) by mouth daily. Take for 21 days on, 7 days off, repeat every 28 days. 21 tablet 0  . promethazine (PHENERGAN) 25 MG suppository Place 1 suppository (25 mg total) rectally every 6 (six) hours as needed for nausea or vomiting. 12 each 3  . ciprofloxacin (CIPRO) 500 MG tablet Take 1 tablet (500 mg total) by mouth 2 (two) times daily. 14 tablet 0  . hydrOXYzine (ATARAX/VISTARIL) 25 MG tablet Take 1 tablet (25 mg total) by mouth at bedtime and may repeat dose one time if needed. 60 tablet 0  . mirtazapine (REMERON) 7.5 MG tablet Take 1 tablet (7.5 mg total) by mouth at bedtime. 30 tablet 1  . ondansetron (ZOFRAN-ODT) 8 MG disintegrating tablet Take by mouth.    . prochlorperazine (COMPAZINE) 10 MG tablet Take by mouth.    . valACYclovir (VALTREX) 500 MG tablet 1 tablet bid x 3 days    . Zoledronic Acid (ZOMETA) 4 MG/100ML IVPB Inject into the vein.     No current facility-administered medications for this visit.   Facility-Administered Medications Ordered in Other Visits  Medication Dose Route Frequency Provider Last Rate Last Admin  . 0.9 %  sodium chloride infusion (Manually program via Guardrails IV Fluids)  250  mL Intravenous Once Alla Feeling, NP        PHYSICAL EXAMINATION: ECOG PERFORMANCE STATUS: 3 - Symptomatic, >50% confined to bed  Vitals:   05/15/20 0857 05/15/20 0903  BP: (!) 85/53 (!) 94/58  Pulse:    Resp:    Temp:    SpO2:     Filed Weights   05/15/20 0855  Weight: 120 lb 14.4 oz (54.8 kg)    GENERAL: awake but drowsy, no distress, in wheelchair  SKIN: pale, dry. No rash  EYES: normal, Conjunctiva are pink and  non-injected, sclera clear OROPHARYNX: dry mucous membranes. No thrush or ulcers.  NECK: palpable low left cervical node  LUNGS: clear with normal breathing effort HEART: regular rate & rhythm, no lower extremity edema ABDOMEN: abdomen soft, non-tender and normal bowel sounds NEURO: alert & oriented x 3 with fluent speech, weak.  LABORATORY DATA:  I have reviewed the data as listed CBC Latest Ref Rng & Units 05/15/2020 05/01/2020 04/17/2020  WBC 4.0 - 10.5 K/uL 4.7 10.4 5.7  Hemoglobin 12.0 - 15.0 g/dL 7.6(L) 9.3(L) 10.0(L)  Hematocrit 36 - 46 % 23.6(L) 28.9(L) 30.6(L)  Platelets 150 - 400 K/uL <5(LL) 13(L) 24(L)     CMP Latest Ref Rng & Units 05/15/2020 05/01/2020 04/17/2020  Glucose 70 - 99 mg/dL 98 93 93  BUN 6 - 20 mg/dL _0 Creatinine 0.44 - 1.00 mg/dL 1.37(H) 0.90 0.99  Sodium 135 - 145 mmol/L 142 146(H) 142  Potassium 3.5 - 5.1 mmol/L 3.7 4.2 4.5  Chloride 98 - 111 mmol/L 106 107 105  CO2 22 - 32 mmol/L _1 Calcium 8.9 - 10.3 mg/dL 8.1(L) 9.3 10.3  Total Protein 6.5 - 8.1 g/dL 5.6(L) 6.3(L) 7.1  Total Bilirubin 0.3 - 1.2 mg/dL 0.6 0.8 0.7  Alkaline Phos 38 - 126 U/L 221(H) 189(H) 180(H)  AST 15 - 41 U/L 287(HH) 561(HH) 227(HH)  ALT 0 - 44 U/L 70(H) 326(HH) 91(H)      RADIOGRAPHIC STUDIES: I have personally reviewed the radiological images as listed and agreed with the findings in the report. No results found.   ASSESSMENT & PLAN: 59 yo female   1. Invasive lobular carcinoma, ypT3N3 ER+/PR+/HER2-, stage IIIB, with osseous metastasis in 02/2020, stage IV  -left breast overlapping and axillary lymph node biopsy 01/26/2017 for a clinical mT2 N1, stage IIA invasive lobular carcinoma, ER+/PR+ and HER2 not amplified  -s/p neoadjuvant Adriamycin/cytoxan - docetaxel for 6 cycles between 03/2017 - 06/2017  -S/p bilateral mastectomies 08/2017, path showed residual invasive lobular carcinoma, ypT3ypN3, stage IIIB -Completed adjuvant radiation to left breast and axilla  11/2017 - 01/2018  -Began adjuvant AI with Anastrozole on 09/22/2017 -imaging including CT chest, PET, and MRI of thoracolumbar spine in 02/2020 showed multiple bone lesions on the axial and appendicular skeleton without neural impingement and without visceral disease  -CT guided right iliac crest biopsy 03/28/20 confirmed metastatic invasive lobular carcinoma now triple negative, MIB-1 of 20% -[place]/F-18 estradiol PET scan on 04/28/20 shows strong estrogen avidity in the bone lesions, questionable brain lesion. The triple negative path result from 03/28/20 biopsy felt to be related to calcification artifact.  -Subsequently discontinued AI and began FUlvestrant and palbociclin on 05/01/20, to start zometa 05/15/20  -brain MRI and liver MRI pending   2. Pancytopenia, secondary to chemotherapy and bone metastases  -Progressive anemia and thrombocytopenia and now also neutropenia since starting Ibrance on 05/01/20 -05/15/20: ANC 0.4, plt <5 with oral bleeding, Hgb 7.6  -  will receive transfusion support, rehydration, and Zarxio for neutropenia.  -Begin prophylactic cipro BID  -cytopenic precautions reviewed.   Disposition:  Jamie Hartman appears weak. She is on second week of Palbociclib. She has poor nutritional/hydration status and low PS. She appears dehydrated; she is hypotensive with AKI Scr 1.37. Transaminitis has improved. Labs indicate progressive severe pancytopenia secondary to palbociclib and bone metastases. She will receive supportive care with 1 unit RBC, 1 unit PLT, 1 L NS over 2 hours today and IVF again on 6/12. She will receive Zarxio today. She will hold Palbociclib. She knows to begin prophylactic antibiotics, she developed rosacea with cipro in the past but otherwise tolerated it. Jamie Hartman understands to report to ED if she develops fever/chills or signs of infection.   We discussed other resources such as SW, dietician, and palliative care. She has DME such as bedside commode from her  husband's knee surgery. She does not want palliative care at home and clearly not ready to discuss hospice. I will refer her to SW to discuss financial resources and dietician for her anorexia. I also discussed starting mirtazapine for her low appetite and depression and she agrees to try.  She will proceed with fulvestrant injection today. Will hold zometa due to her renal dysfunction. She will have brain MRI on 05/16/20 and liver MRI on 6/14 as planned. She will return for lab, follow up, and additional supportive care if needed on 6/15. The patient was seen with Dr. [name].    Orders Placed This Encounter  Procedures  . Ambulatory referral to Social Work    Referral Priority:   Routine    Referral Type:   Consultation    Referral Reason:   Specialty Services Required    Number of Visits Requested:   1  . Ambulatory referral to Nutrition and Diabetic E    Referral Priority:   Urgent    Referral Type:   Consultation    Referral Reason:   Specialty Services Required    Number of Visits Requested:   1  . Care order/instruction    Transfuse Parameters    Standing Status:   Future    Number of Occurrences:   1    Standing Expiration Date:   05/15/2021  . Informed Consent Details: Physician/Practitioner Attestation; Transcribe to consent form and obtain patient signature    Standing Status:   Future    Number of Occurrences:   1    Standing Expiration Date:   05/15/2021    Order Specific Question:   Physician/Practitioner attestation of informed consent for blood and or blood product transfusion    Answer:   I, the physician/practitioner, attest that I have discussed with the patient the benefits, risks, side effects, alternatives, likelihood of achieving goals and potential problems during recovery for the procedure that I have provided informed consent.    Order Specific Question:   Product(s)    Answer:   All Product(s)  . Prepare RBC (crossmatch)    Standing Status:   Standing     Number of Occurrences:   1    Order Specific Question:   # of Units    Answer:   1 unit    Order Specific Question:   Transfusion Indications    Answer:   Symptomatic Anemia    Order Specific Question:   Number of Units to Keep Ahead    Answer:   NO units ahead    Order Specific Question:   If emergent release  call blood bank    Answer:   Not emergent release  . Prepare Pheresed Platelets    Standing Status:   Standing    Number of Occurrences:   1    Order Specific Question:   Number of Apheresis Units    Answer:   1 unit (6-10 packs)    Order Specific Question:   Transfusion Indications    Answer:   Bleeding from Thrombocytopenia    Order Specific Question:   Transfusion Indications    Answer:   PLT Count </=10,000/mm    Order Specific Question:   Special Requirements    Answer:   CMV negative / Leukoreduced    Order Specific Question:   Special Requirements    Answer:   Irradiated   All questions were answered. The patient knows to call the clinic with any problems, questions or concerns. No barriers to learning were detected.     Alla Feeling, NP 05/15/20    ADDENDUM: Jamie Hartman's situation is difficult.  She is declining rapidly.  It has taken [place] quite a long time to get her liver MRI approved but finally that is scheduled for next week.  She is receiving fulvestrant but we are going to have to hold her palbociclib until her white cells recover.  We are going to try some growth factor between now and next week to see if we can help with that.  She will receive platelets and blood today.  I suspect she will need further support when she returns to see [place] on 05/20/2020 and I have requested time for an infusion on 615 and 616 and also a type and screen on 615 to facilitate that.  The possibility of hospice or even palliative care was suggested and the patient was quite adamant that she is "not ready to die" at this point.  We are going to continue to support maximally while trying  to get her through her initial treatments, which I have reason to hope will work for her.  The little bit of good news so far is that her liver function tests are better.  Total encounter time 35 minutes.*  I personally saw this patient and performed a substantive portion of this encounter with the listed APP documented above.   Chauncey Cruel, MD Medical Oncology and Hematology [facility] [address] [place],  [address] Tel. [phone]    Fax. [phone]

## 2020-05-15 ENCOUNTER — Inpatient Hospital Stay: Payer: Medicare HMO

## 2020-05-15 ENCOUNTER — Encounter: Payer: Self-pay | Admitting: Nurse Practitioner

## 2020-05-15 ENCOUNTER — Other Ambulatory Visit: Payer: Self-pay

## 2020-05-15 ENCOUNTER — Inpatient Hospital Stay: Payer: Medicare HMO | Attending: Oncology | Admitting: Nurse Practitioner

## 2020-05-15 ENCOUNTER — Ambulatory Visit: Payer: Medicare HMO

## 2020-05-15 ENCOUNTER — Telehealth: Payer: Self-pay | Admitting: Nurse Practitioner

## 2020-05-15 VITALS — BP 109/74 | HR 77 | Temp 97.6°F | Resp 18

## 2020-05-15 VITALS — BP 94/58 | HR 93 | Temp 97.5°F | Resp 18 | Ht 62.0 in | Wt 120.9 lb

## 2020-05-15 DIAGNOSIS — Z800 Family history of malignant neoplasm of digestive organs: Secondary | ICD-10-CM | POA: Diagnosis not present

## 2020-05-15 DIAGNOSIS — D61810 Antineoplastic chemotherapy induced pancytopenia: Secondary | ICD-10-CM | POA: Insufficient documentation

## 2020-05-15 DIAGNOSIS — Z9882 Breast implant status: Secondary | ICD-10-CM | POA: Diagnosis not present

## 2020-05-15 DIAGNOSIS — Z79899 Other long term (current) drug therapy: Secondary | ICD-10-CM | POA: Diagnosis not present

## 2020-05-15 DIAGNOSIS — Z79811 Long term (current) use of aromatase inhibitors: Secondary | ICD-10-CM | POA: Diagnosis not present

## 2020-05-15 DIAGNOSIS — J438 Other emphysema: Secondary | ICD-10-CM

## 2020-05-15 DIAGNOSIS — C773 Secondary and unspecified malignant neoplasm of axilla and upper limb lymph nodes: Secondary | ICD-10-CM | POA: Insufficient documentation

## 2020-05-15 DIAGNOSIS — C50812 Malignant neoplasm of overlapping sites of left female breast: Secondary | ICD-10-CM

## 2020-05-15 DIAGNOSIS — D649 Anemia, unspecified: Secondary | ICD-10-CM

## 2020-05-15 DIAGNOSIS — D701 Agranulocytosis secondary to cancer chemotherapy: Secondary | ICD-10-CM | POA: Diagnosis not present

## 2020-05-15 DIAGNOSIS — R630 Anorexia: Secondary | ICD-10-CM | POA: Insufficient documentation

## 2020-05-15 DIAGNOSIS — Z803 Family history of malignant neoplasm of breast: Secondary | ICD-10-CM | POA: Diagnosis not present

## 2020-05-15 DIAGNOSIS — C7951 Secondary malignant neoplasm of bone: Secondary | ICD-10-CM

## 2020-05-15 DIAGNOSIS — D696 Thrombocytopenia, unspecified: Secondary | ICD-10-CM

## 2020-05-15 DIAGNOSIS — R0602 Shortness of breath: Secondary | ICD-10-CM | POA: Insufficient documentation

## 2020-05-15 DIAGNOSIS — R519 Headache, unspecified: Secondary | ICD-10-CM

## 2020-05-15 DIAGNOSIS — Z9013 Acquired absence of bilateral breasts and nipples: Secondary | ICD-10-CM | POA: Diagnosis not present

## 2020-05-15 DIAGNOSIS — Z923 Personal history of irradiation: Secondary | ICD-10-CM | POA: Diagnosis not present

## 2020-05-15 DIAGNOSIS — K219 Gastro-esophageal reflux disease without esophagitis: Secondary | ICD-10-CM

## 2020-05-15 DIAGNOSIS — Z5189 Encounter for other specified aftercare: Secondary | ICD-10-CM | POA: Insufficient documentation

## 2020-05-15 DIAGNOSIS — F329 Major depressive disorder, single episode, unspecified: Secondary | ICD-10-CM | POA: Insufficient documentation

## 2020-05-15 DIAGNOSIS — I700 Atherosclerosis of aorta: Secondary | ICD-10-CM

## 2020-05-15 DIAGNOSIS — C50912 Malignant neoplasm of unspecified site of left female breast: Secondary | ICD-10-CM

## 2020-05-15 DIAGNOSIS — Z833 Family history of diabetes mellitus: Secondary | ICD-10-CM | POA: Insufficient documentation

## 2020-05-15 DIAGNOSIS — Z5111 Encounter for antineoplastic chemotherapy: Secondary | ICD-10-CM | POA: Diagnosis present

## 2020-05-15 DIAGNOSIS — Z170 Estrogen receptor positive status [ER+]: Secondary | ICD-10-CM | POA: Insufficient documentation

## 2020-05-15 DIAGNOSIS — F17210 Nicotine dependence, cigarettes, uncomplicated: Secondary | ICD-10-CM | POA: Insufficient documentation

## 2020-05-15 DIAGNOSIS — Z7189 Other specified counseling: Secondary | ICD-10-CM

## 2020-05-15 LAB — CBC WITH DIFFERENTIAL/PLATELET
Abs Immature Granulocytes: 0.01 10*3/uL (ref 0.00–0.07)
Basophils Absolute: 0 10*3/uL (ref 0.0–0.1)
Basophils Relative: 0 %
Eosinophils Absolute: 0 10*3/uL (ref 0.0–0.5)
Eosinophils Relative: 0 %
HCT: 23.6 % — ABNORMAL LOW (ref 36.0–46.0)
Hemoglobin: 7.6 g/dL — ABNORMAL LOW (ref 12.0–15.0)
Immature Granulocytes: 0 %
Lymphocytes Relative: 90 %
Lymphs Abs: 4.1 10*3/uL — ABNORMAL HIGH (ref 0.7–4.0)
MCH: 28.9 pg (ref 26.0–34.0)
MCHC: 32.2 g/dL (ref 30.0–36.0)
MCV: 89.7 fL (ref 80.0–100.0)
Monocytes Absolute: 0.1 10*3/uL (ref 0.1–1.0)
Monocytes Relative: 2 %
Neutro Abs: 0.4 10*3/uL — CL (ref 1.7–7.7)
Neutrophils Relative %: 8 %
Platelets: <5 10*3/uL — CL (ref 150–400)
RBC: 2.63 MIL/uL — ABNORMAL LOW (ref 3.87–5.11)
RDW: 20.1 % — ABNORMAL HIGH (ref 11.5–15.5)
WBC: 4.7 10*3/uL (ref 4.0–10.5)
nRBC: 2.3 % — ABNORMAL HIGH (ref 0.0–0.2)

## 2020-05-15 LAB — COMPREHENSIVE METABOLIC PANEL
ALT: 70 U/L — ABNORMAL HIGH (ref 0–44)
AST: 287 U/L (ref 15–41)
Albumin: 2.4 g/dL — ABNORMAL LOW (ref 3.5–5.0)
Alkaline Phosphatase: 221 U/L — ABNORMAL HIGH (ref 38–126)
Anion gap: 12 (ref 5–15)
BUN: 14 mg/dL (ref 6–20)
CO2: 24 mmol/L (ref 22–32)
Calcium: 8.1 mg/dL — ABNORMAL LOW (ref 8.9–10.3)
Chloride: 106 mmol/L (ref 98–111)
Creatinine, Ser: 1.37 mg/dL — ABNORMAL HIGH (ref 0.44–1.00)
GFR calc Af Amer: 49 mL/min — ABNORMAL LOW (ref >60)
GFR calc non Af Amer: 42 mL/min — ABNORMAL LOW (ref >60)
Glucose, Bld: 98 mg/dL (ref 70–99)
Potassium: 3.7 mmol/L (ref 3.5–5.1)
Sodium: 142 mmol/L (ref 135–145)
Total Bilirubin: 0.6 mg/dL (ref 0.3–1.2)
Total Protein: 5.6 g/dL — ABNORMAL LOW (ref 6.5–8.1)

## 2020-05-15 LAB — PREPARE RBC (CROSSMATCH)

## 2020-05-15 MED ORDER — SODIUM CHLORIDE 0.9% IV SOLUTION
250.0000 mL | Freq: Once | INTRAVENOUS | Status: AC
  Administered 2020-05-15: 250 mL via INTRAVENOUS
  Filled 2020-05-15: qty 250

## 2020-05-15 MED ORDER — FILGRASTIM-SNDZ 300 MCG/0.5ML IJ SOSY
PREFILLED_SYRINGE | INTRAMUSCULAR | Status: AC
  Filled 2020-05-15: qty 0.5

## 2020-05-15 MED ORDER — HEPARIN SOD (PORK) LOCK FLUSH 100 UNIT/ML IV SOLN
500.0000 [IU] | Freq: Every day | INTRAVENOUS | Status: AC | PRN
  Administered 2020-05-15: 500 [IU]
  Filled 2020-05-15: qty 5

## 2020-05-15 MED ORDER — ACETAMINOPHEN 325 MG PO TABS
650.0000 mg | ORAL_TABLET | Freq: Once | ORAL | Status: AC
  Administered 2020-05-15: 650 mg via ORAL

## 2020-05-15 MED ORDER — FAMOTIDINE 20 MG PO TABS
20.0000 mg | ORAL_TABLET | Freq: Once | ORAL | Status: AC
  Administered 2020-05-15: 20 mg via ORAL

## 2020-05-15 MED ORDER — FILGRASTIM-SNDZ 300 MCG/0.5ML IJ SOSY
300.0000 ug | PREFILLED_SYRINGE | Freq: Once | INTRAMUSCULAR | Status: AC
  Administered 2020-05-15: 300 ug via SUBCUTANEOUS

## 2020-05-15 MED ORDER — SODIUM CHLORIDE 0.9 % IV SOLN
Freq: Once | INTRAVENOUS | Status: AC
  Filled 2020-05-15: qty 250

## 2020-05-15 MED ORDER — CIPROFLOXACIN HCL 500 MG PO TABS: 500.0000 mg | ORAL_TABLET | Freq: Two times a day (BID) | ORAL | 0 refills | Status: DC

## 2020-05-15 MED ORDER — ACETAMINOPHEN 325 MG PO TABS
ORAL_TABLET | ORAL | Status: AC
  Filled 2020-05-15: qty 2

## 2020-05-15 MED ORDER — FULVESTRANT 250 MG/5ML IM SOLN
500.0000 mg | Freq: Once | INTRAMUSCULAR | Status: AC
  Administered 2020-05-15: 500 mg via INTRAMUSCULAR

## 2020-05-15 MED ORDER — SODIUM CHLORIDE 0.9% FLUSH
10.0000 mL | INTRAVENOUS | Status: AC | PRN
  Administered 2020-05-15: 10 mL
  Filled 2020-05-15: qty 10

## 2020-05-15 MED ORDER — FULVESTRANT 250 MG/5ML IM SOLN
INTRAMUSCULAR | Status: AC
  Filled 2020-05-15: qty 10

## 2020-05-15 MED ORDER — FAMOTIDINE 20 MG PO TABS
ORAL_TABLET | ORAL | Status: AC
  Filled 2020-05-15: qty 1

## 2020-05-15 MED ORDER — MIRTAZAPINE 7.5 MG PO TABS
7.5000 mg | ORAL_TABLET | Freq: Every day | ORAL | 1 refills | Status: DC
Start: 2020-05-15 — End: 2020-06-19

## 2020-05-15 NOTE — Telephone Encounter (Signed)
Scheduled appt per 6/10 los.  Pt will get an updated appt calendar at her appt on Saturday.  Pt was aware of her 6/12 appt already because she was in symptom management and got a print out.

## 2020-05-15 NOTE — Patient Instructions (Addendum)
Fulvestrant injection What is this medicine? FULVESTRANT (ful VES trant) blocks the effects of estrogen. It is used to treat breast cancer. This medicine may be used for other purposes; ask your health care provider or pharmacist if you have questions. COMMON BRAND NAME(S): FASLODEX What should I tell my health care provider before I take this medicine? They need to know if you have any of these conditions:  bleeding disorders  liver disease  low blood counts, like low white cell, platelet, or red cell counts  an unusual or allergic reaction to fulvestrant, other medicines, foods, dyes, or preservatives  pregnant or trying to get pregnant  breast-feeding How should I use this medicine? This medicine is for injection into a muscle. It is usually given by a health care professional in a hospital or clinic setting. Talk to your pediatrician regarding the use of this medicine in children. Special care may be needed. Overdosage: If you think you have taken too much of this medicine contact a poison control center or emergency room at once. NOTE: This medicine is only for you. Do not share this medicine with others. What if I miss a dose? It is important not to miss your dose. Call your doctor or health care professional if you are unable to keep an appointment. What may interact with this medicine?  medicines that treat or prevent blood clots like warfarin, enoxaparin, dalteparin, apixaban, dabigatran, and rivaroxaban This list may not describe all possible interactions. Give your health care provider a list of all the medicines, herbs, non-prescription drugs, or dietary supplements you use. Also tell them if you smoke, drink alcohol, or use illegal drugs. Some items may interact with your medicine. What should I watch for while using this medicine? Your condition will be monitored carefully while you are receiving this medicine. You will need important blood work done while you are taking  this medicine. Do not become pregnant while taking this medicine or for at least 1 year after stopping it. Women of child-bearing potential will need to have a negative pregnancy test before starting this medicine. Women should inform their doctor if they wish to become pregnant or think they might be pregnant. There is a potential for serious side effects to an unborn child. Men should inform their doctors if they wish to father a child. This medicine may lower sperm counts. Talk to your health care professional or pharmacist for more information. Do not breast-feed an infant while taking this medicine or for 1 year after the last dose. What side effects may I notice from receiving this medicine? Side effects that you should report to your doctor or health care professional as soon as possible:  allergic reactions like skin rash, itching or hives, swelling of the face, lips, or tongue  feeling faint or lightheaded, falls  pain, tingling, numbness, or weakness in the legs  signs and symptoms of infection like fever or chills; cough; flu-like symptoms; sore throat  vaginal bleeding Side effects that usually do not require medical attention (report to your doctor or health care professional if they continue or are bothersome):  aches, pains  constipation  diarrhea  headache  hot flashes  nausea, vomiting  pain at site where injected  stomach pain This list may not describe all possible side effects. Call your doctor for medical advice about side effects. You may report side effects to FDA at 1-800-FDA-1088. Where should I keep my medicine? This drug is given in a hospital or clinic and will   not be stored at home. NOTE: This sheet is a summary. It may not cover all possible information. If you have questions about this medicine, talk to your doctor, pharmacist, or health care provider.  2020 Elsevier/Gold Standard (2018-03-02 11:34:41)    Filgrastim, G-CSF injection What is this  medicine? FILGRASTIM, G-CSF (fil GRA stim) is a granulocyte colony-stimulating factor that stimulates the growth of neutrophils, a type of white blood cell (WBC) important in the body's fight against infection. It is used to reduce the incidence of fever and infection in patients with certain types of cancer who are receiving chemotherapy that affects the bone marrow, to stimulate blood cell production for removal of WBCs from the body prior to a bone marrow transplantation, to reduce the incidence of fever and infection in patients who have severe chronic neutropenia, and to improve survival outcomes following high-dose radiation exposure that is toxic to the bone marrow. This medicine may be used for other purposes; ask your health care provider or pharmacist if you have questions. COMMON BRAND NAME(S): Neupogen, Nivestym, Zarxio What should I tell my health care provider before I take this medicine? They need to know if you have any of these conditions:  kidney disease  latex allergy  ongoing radiation therapy  sickle cell disease  an unusual or allergic reaction to filgrastim, pegfilgrastim, other medicines, foods, dyes, or preservatives  pregnant or trying to get pregnant  breast-feeding How should I use this medicine? This medicine is for injection under the skin or infusion into a vein. As an infusion into a vein, it is usually given by a health care professional in a hospital or clinic setting. If you get this medicine at home, you will be taught how to prepare and give this medicine. Refer to the Instructions for Use that come with your medication packaging. Use exactly as directed. Take your medicine at regular intervals. Do not take your medicine more often than directed. It is important that you put your used needles and syringes in a special sharps container. Do not put them in a trash can. If you do not have a sharps container, call your pharmacist or healthcare provider to get  one. Talk to your pediatrician regarding the use of this medicine in children. While this drug may be prescribed for children as young as 7 months for selected conditions, precautions do apply. Overdosage: If you think you have taken too much of this medicine contact a poison control center or emergency room at once. NOTE: This medicine is only for you. Do not share this medicine with others. What if I miss a dose? It is important not to miss your dose. Call your doctor or health care professional if you miss a dose. What may interact with this medicine? This medicine may interact with the following medications:  medicines that may cause a release of neutrophils, such as lithium This list may not describe all possible interactions. Give your health care provider a list of all the medicines, herbs, non-prescription drugs, or dietary supplements you use. Also tell them if you smoke, drink alcohol, or use illegal drugs. Some items may interact with your medicine. What should I watch for while using this medicine? You may need blood work done while you are taking this medicine. What side effects may I notice from receiving this medicine? Side effects that you should report to your doctor or health care professional as soon as possible:  allergic reactions like skin rash, itching or hives, swelling of the  face, lips, or tongue  back pain  dizziness or feeling faint  fever  pain, redness, or irritation at site where injected  pinpoint red spots on the skin  shortness of breath or breathing problems  signs and symptoms of kidney injury like trouble passing urine, change in the amount of urine, or red or dark-brown urine  stomach or side pain, or pain at the shoulder  swelling  tiredness  unusual bleeding or bruising Side effects that usually do not require medical attention (report to your doctor or health care professional if they continue or are bothersome):  bone  pain  cough  diarrhea  hair loss  headache  muscle pain This list may not describe all possible side effects. Call your doctor for medical advice about side effects. You may report side effects to FDA at 1-800-FDA-1088. Where should I keep my medicine? Keep out of the reach of children. Store in a refrigerator between 2 and 8 degrees C (36 and 46 degrees F). Do not freeze. Keep in carton to protect from light. Throw away this medicine if vials or syringes are left out of the refrigerator for more than 24 hours. Throw away any unused medicine after the expiration date. NOTE: This sheet is a summary. It may not cover all possible information. If you have questions about this medicine, talk to your doctor, pharmacist, or health care provider.  2020 Elsevier/Gold Standard (2018-09-21 16:55:01)    Rehydration, Adult Rehydration is the replacement of body fluids and salts and minerals (electrolytes) that are lost during dehydration. Dehydration is when there is not enough fluid or water in the body. This happens when you lose more fluids than you take in. Common causes of dehydration include:  Vomiting.  Diarrhea.  Excessive sweating, such as from heat exposure or exercise.  Taking medicines that cause the body to lose excess fluid (diuretics).  Impaired kidney function.  Not drinking enough fluid.  Certain illnesses or infections.  Certain poorly controlled long-term (chronic) illnesses, such as diabetes, heart disease, and kidney disease.  Symptoms of mild dehydration may include thirst, dry lips and mouth, dry skin, and dizziness. Symptoms of severe dehydration may include increased heart rate, confusion, fainting, and not urinating. You can rehydrate by drinking certain fluids or getting fluids through an IV tube, as told by your health care provider. What are the risks? Generally, rehydration is safe. However, one problem that can happen is taking in too much fluid  (overhydration). This is rare. If overhydration happens, it can cause an electrolyte imbalance, kidney failure, or a decrease in salt (sodium) levels in the body. How to rehydrate Follow instructions from your health care provider for rehydration. The kind of fluid you should drink and the amount you should drink depend on your condition.  If directed by your health care provider, drink an oral rehydration solution (ORS). This is a drink designed to treat dehydration that is found in pharmacies and retail stores. ? Make an ORS by following instructions on the package. ? Start by drinking small amounts, about  cup (120 mL) every 5-10 minutes. ? Slowly increase how much you drink until you have taken the amount recommended by your health care provider.  Drink enough clear fluids to keep your urine clear or pale yellow. If you were instructed to drink an ORS, finish the ORS first, then start slowly drinking other clear fluids. Drink fluids such as: ? Water. Do not drink only water. Doing that can lead to having too  little sodium in your body (hyponatremia). ? Ice chips. ? Fruit juice that you have added water to (diluted juice). ? Low-calorie sports drinks.  If you are severely dehydrated, your health care provider may recommend that you receive fluids through an IV tube in the hospital.  Do not take sodium tablets. Doing that can lead to the condition of having too much sodium in your body (hypernatremia). Eating while you rehydrate Follow instructions from your health care provider about what to eat while you rehydrate. Your health care provider may recommend that you slowly begin eating regular foods in small amounts.  Eat foods that contain a healthy balance of electrolytes, such as bananas, oranges, potatoes, tomatoes, and spinach.  Avoid foods that are greasy or contain a lot of fat or sugar.  In some cases, you may get nutrition through a feeding tube that is passed through your nose  and into your stomach (nasogastric tube, or NG tube). This may be done if you have uncontrolled vomiting or diarrhea. Beverages to avoid Certain beverages may make dehydration worse. While you rehydrate, avoid:  Alcohol.  Caffeine.  Drinks that contain a lot of sugar. These include: ? High-calorie sports drinks. ? Fruit juice that is not diluted. ? Soda.  Check nutrition labels to see how much sugar or caffeine a beverage contains. Signs of dehydration recovery You may be recovering from dehydration if:  You are urinating more often than before you started rehydrating.  Your urine is clear or pale yellow.  Your energy level improves.  You vomit less frequently.  You have diarrhea less frequently.  Your appetite improves or returns to normal.  You feel less dizzy or less light-headed.  Your skin tone and color start to look more normal. Contact a health care provider if:  You continue to have symptoms of mild dehydration, such as: ? Thirst. ? Dry lips. ? Slightly dry mouth. ? Dry, warm skin. ? Dizziness.  You continue to vomit or have diarrhea. Get help right away if:  You have symptoms of dehydration that get worse.  You feel: ? Confused. ? Weak. ? Like you are going to faint.  You have not urinated in 6-8 hours.  You have very dark urine.  You have trouble breathing.  Your heart rate while sitting still is over 100 beats a minute.  You cannot drink fluids without vomiting.  You have vomiting or diarrhea that: ? Gets worse. ? Does not go away.  You have a fever. This information is not intended to replace advice given to you by your health care provider. Make sure you discuss any questions you have with your health care provider. Document Revised: 11/04/2017 Document Reviewed: 01/16/2016 Elsevier Patient Education  [place].    Platelet Transfusion A platelet transfusion is a procedure in which you receive donated platelets through an  IV. Platelets are tiny pieces of blood cells. When you get an injury, platelets clump together in the area to form a blood clot. This helps stop bleeding and is the beginning of the healing process. If you have too few platelets, your blood may have trouble clotting. This may cause you to bleed and bruise very easily. You may need a platelet transfusion if you have a condition that causes a low number of platelets (thrombocytopenia). A platelet transfusion may be used to stop or prevent excessive bleeding. Tell a health care provider about:  Any reactions you have had during previous transfusions.  Any allergies you have.  All medicines you are taking, including vitamins, herbs, eye drops, creams, and over-the-counter medicines.  Any blood disorders you have.  Any surgeries you have had.  Any medical conditions you have.  Whether you are pregnant or may be pregnant. What are the risks? Generally, this is a safe procedure. However, problems may occur, including:  Fever.  Infection.  Allergic reaction to the donor platelets.  Your body's disease-fighting system (immune system) attacking the donor platelets (hemolytic reaction). This is rare.  A rare reaction that causes lung damage (transfusion-related acute lung injury). What happens before the procedure? Medicines  Ask your health care provider about: ? Changing or stopping your regular medicines. This is especially important if you are taking diabetes medicines or blood thinners. ? Taking medicines such as aspirin and ibuprofen. These medicines can thin your blood. Do not take these medicines unless your health care provider tells you to take them. ? Taking over-the-counter medicines, vitamins, herbs, and supplements. General instructions  You will have a blood test to determine your blood type. Your blood type determines what kind of platelets you will be given.  Follow instructions from your health care provider about  eating or drinking restrictions.  If you have had an allergic reaction to a transfusion in the past, you may be given medicine to help prevent a reaction.  Your temperature, blood pressure, pulse, and breathing will be monitored. What happens during the procedure?   An IV will be inserted into one of your veins.  For your safety, two health care providers will verify your identity along with the donor platelets about to be infused.  A bag of donor platelets will be connected to your IV. The platelets will flow into your bloodstream. This usually takes 30-60 minutes.  Your temperature, blood pressure, pulse, and breathing will be monitored during the transfusion. This helps detect early signs of any reaction.  You will also be monitored for other symptoms that may indicate a reaction, including chills, hives, or itching.  If you have signs of a reaction at any time, your transfusion will be stopped, and you may be given medicine to help manage the reaction.  When your transfusion is complete, your IV will be removed.  Pressure may be applied to the IV site for a few minutes to stop any bleeding.  The IV site will be covered with a bandage (dressing). The procedure may vary among health care providers and hospitals. What happens after the procedure?  Your blood pressure, temperature, pulse, and breathing will be monitored until you leave the hospital or clinic.  You may have some bruising and soreness at your IV site. Follow these instructions at home: Medicines  Take over-the-counter and prescription medicines only as told by your health care provider.  Talk with your health care provider before you take any medicines that contain aspirin or NSAIDs. These medicines increase your risk for dangerous bleeding. General instructions  Change or remove your dressing as told by your health care provider.  Return to your normal activities as told by your health care provider. Ask your  health care provider what activities are safe for you.  Do not take baths, swim, or use a hot tub until your health care provider approves. Ask your health care provider if you may take showers.  Check your IV site every day for signs of infection. Check for: ? Redness, swelling, or pain. ? Fluid or blood. If fluid or blood drains from your IV site, use your hands  to press down firmly on a bandage covering the area for a minute or two. Doing this should stop the bleeding. ? Warmth. ? Pus or a bad smell.  Keep all follow-up visits as told by your health care provider. This is important. Contact a health care provider if you have:  A headache that does not go away with medicine.  Hives, rash, or itchy skin.  Nausea or vomiting.  Unusual tiredness or weakness.  Signs of infection at your IV site. Get help right away if:  You have a fever or chills.  You urinate less often than usual.  Your urine is darker colored than normal.  You have any of the following: ? Trouble breathing. ? Pain in your back, abdomen, or chest. ? Cool, clammy skin. ? A fast heartbeat. Summary  Platelets are tiny pieces of blood cells that clump together to form a blood clot when you have an injury. If you have too few platelets, your blood may have trouble clotting.  A platelet transfusion is a procedure in which you receive donated platelets through an IV.  A platelet transfusion may be used to stop or prevent excessive bleeding.  After the procedure, check your IV site every day for signs of infection, including redness, swelling, pain, or warmth. This information is not intended to replace advice given to you by your health care provider. Make sure you discuss any questions you have with your health care provider. Document Revised: 12/28/2017 Document Reviewed: 12/28/2017 Elsevier Patient Education  2020 [place].    Blood Transfusion, Adult, Care After This sheet gives you information  about how to care for yourself after your procedure. Your doctor may also give you more specific instructions. If you have problems or questions, contact your doctor. What can I expect after the procedure? After the procedure, it is common to have:  Bruising and soreness at the IV site.  A fever or chills on the day of the procedure. This may be your body's response to the new blood cells received.  A headache. Follow these instructions at home: Insertion site care      Follow instructions from your doctor about how to take care of your insertion site. This is where an IV tube was put into your vein. Make sure you: ? Wash your hands with soap and water before and after you change your bandage (dressing). If you cannot use soap and water, use hand sanitizer. ? Change your bandage as told by your doctor.  Check your insertion site every day for signs of infection. Check for: ? Redness, swelling, or pain. ? Bleeding from the site. ? Warmth. ? Pus or a bad smell. General instructions  Take over-the-counter and prescription medicines only as told by your doctor.  Rest as told by your doctor.  Go back to your normal activities as told by your doctor.  Keep all follow-up visits as told by your doctor. This is important. Contact a doctor if:  You have itching or red, swollen areas of skin (hives).  You feel worried or nervous (anxious).  You feel weak after doing your normal activities.  You have redness, swelling, warmth, or pain around the insertion site.  You have blood coming from the insertion site, and the blood does not stop with pressure.  You have pus or a bad smell coming from the insertion site. Get help right away if:  You have signs of a serious reaction. This may be coming from an allergy or  the body's defense system (immune system). Signs include: ? Trouble breathing or shortness of breath. ? Swelling of the face or feeling warm (flushed). ? Fever or  chills. ? Head, chest, or back pain. ? Dark pee (urine) or blood in the pee. ? Widespread rash. ? Fast heartbeat. ? Feeling dizzy or light-headed. You may receive your blood transfusion in an outpatient setting. If so, you will be told whom to contact to report any reactions. These symptoms may be an emergency. Do not wait to see if the symptoms will go away. Get medical help right away. Call your local emergency services (911 in the [place] Do not drive yourself to the hospital. Summary  Bruising and soreness at the IV site are common.  Check your insertion site every day for signs of infection.  Rest as told by your doctor. Go back to your normal activities as told by your doctor.  Get help right away if you have signs of a serious reaction. This information is not intended to replace advice given to you by your health care provider. Make sure you discuss any questions you have with your health care provider. Document Revised: 05/17/2019 Document Reviewed: 05/17/2019 Elsevier Patient Education  [place].

## 2020-05-15 NOTE — Progress Notes (Signed)
Pt to receive 1L IVF NS, 1 unit platelets, 1 unit PRBCs, zarxio injection, and faslodex injection today.  Zometa held per NP Lacie.  Pt did not tolerate platelet transfusion rate of 540ml/hr, reported headache and increased breathing rate.  Denied CP/acute SOB/other signs of allergic reaction.  Decreased rate to 300 ml/hr and ran IVF NS at 250 ml/hr concurrently per NP Lacie, also administered oral 650 tylenol.  Pt reports resolution of breathing issue and decreased headache.  Pt later reports reflux after taking tylenol, states she hasn't eaten anything today.  Pt provided with snacks and pepcid and oral fluids, tolerated well and reports improvement in reflux.  Ok to administer NS IVF at same time as blood transfusion (both at 265ml/hr) per NP Lacie.  Pt tolerated both well.  VSS.  Pt able to ambulate to restroom during tx.  Pt aware of upcoming nutrition telephone appt tomorrow and of IVF appt on Saturday (05/17/20).  Pt advised to hold off on taking Covid-19 shot for now d/t immune status per NP Lacie.

## 2020-05-16 ENCOUNTER — Inpatient Hospital Stay: Payer: Medicare HMO | Admitting: Nutrition

## 2020-05-16 ENCOUNTER — Telehealth: Payer: Self-pay | Admitting: Nutrition

## 2020-05-16 ENCOUNTER — Telehealth: Payer: Self-pay

## 2020-05-16 ENCOUNTER — Ambulatory Visit (HOSPITAL_COMMUNITY): Payer: Medicare HMO

## 2020-05-16 ENCOUNTER — Telehealth: Payer: Self-pay | Admitting: Licensed Clinical Social Worker

## 2020-05-16 LAB — TYPE AND SCREEN
ABO/RH(D): O POS
Antibody Screen: NEGATIVE
Unit division: 0

## 2020-05-16 LAB — PREPARE PLATELET PHERESIS: Unit division: 0

## 2020-05-16 LAB — BPAM PLATELET PHERESIS
Blood Product Expiration Date: 202106102359
ISSUE DATE / TIME: 202106101047
Unit Type and Rh: 8400

## 2020-05-16 LAB — BPAM RBC
Blood Product Expiration Date: 202107132359
ISSUE DATE / TIME: 202106101206
Unit Type and Rh: 5100

## 2020-05-16 NOTE — Telephone Encounter (Signed)
Received message from patient stating that she needed to follow up with Lacie in regard to the medication that she prescribed her to yesterday. An Antibiotic. She stated that since she has been taking it she has been throwing up and wanted the nurse to follow up with her so she knows what to do. Let Lacie's nurse [facility] RN know about patient concern and follow up.

## 2020-05-16 NOTE — Telephone Encounter (Signed)
Contacted patient by telephone per consult.  Patient is a 59 year old female diagnosed with metastatic triple negative breast cancer.  She is a patient of Dr. [name].  Patient is being treated with Ibrance and Zometa.  Past medical history reviewed but was not significant.  Medications include vitamin D3, Phenergan suppositories, Remeron, Zofran, and Compazine.  Labs include creatinine 1.37.  Height: 5 feet 2 inches. Weight: 120.9 pounds on June 10. Usual body weight: 128 pounds Apr 17, 2020. BMI: 22.11.  Patient complains of intermittent nausea but does not take nausea medicines.  She only takes Phenergan suppositories prior to oral chemotherapy.  She reports very poor to no appetite therefore she does not eat much.  She snacks on pudding cups and a protein bar but reports little to no substantial intake over the past 5 weeks. Patient reports lactose intolerance and says that is why she does not tolerate Ensure.  Nutrition diagnosis: Unintended weight loss related to metastatic breast cancer as evidenced by 7.1 pounds weight loss over 1 month.  Intervention: Educated patient on taking nausea medications as prescribed.  If she has nausea she needs to take nausea medicine.  Explained importance of doing this so that she is able to eat more foods. Recommended higher calorie, higher protein foods in small amounts throughout the day. Educated patient to consume high-calorie shakes.  Recommended slow intake over 15 to 30 minutes.  Educated patient that Ensure and boost products do not contain lactose. I will provide fact sheets to patient by mail along with contact information for further questions or concerns.  Monitoring, evaluation, goals: Patient will tolerate increased calories and protein to minimize weight loss.  Next visit: Patient will contact me for questions or concerns.  **Disclaimer: This note was dictated with voice recognition software. Similar sounding words can  inadvertently be transcribed and this note may contain transcription errors which may not have been corrected upon publication of note.**

## 2020-05-16 NOTE — Telephone Encounter (Signed)
[facility] [place] Clinical Social Work  Clinical Social Work was referred by [name], NP for assessment of psychosocial needs.  Clinical Social Worker contacted patient by phone  to offer support and assess for needs.  Patient reported that she and her husband are interested in applying for financial assistance through foundations but she would like to discuss with her husband as part of the conversation and he was unavailable. Patient agreed to have CSW see her & her husband during her appointment on Tuesday 05/20/2020. CSW will also provide information on other support services at that time.      Haynes Giannotti, [place], [place] Worker [facility]

## 2020-05-16 NOTE — Telephone Encounter (Signed)
receoved this message via in basket from [employer] "Patient called stating she needs to get in touch with Lacie about medication she prescribed by [facility] yesterday.antibiotic she think she is allergic to it. Since she's been taking it she has been throwing up. Needed someone to follow up with her." I returned her call.  She states she took the first pill before bed on an empty stomach.  She did not take more since last night.  I told her to eat something before she takes the cipro.  I also recommended she takes one of her antiemetic medications 1 hour prior to the cipro.  She states she will try that.

## 2020-05-17 ENCOUNTER — Inpatient Hospital Stay: Payer: Medicare HMO

## 2020-05-17 ENCOUNTER — Other Ambulatory Visit: Payer: Self-pay

## 2020-05-17 DIAGNOSIS — C7951 Secondary malignant neoplasm of bone: Secondary | ICD-10-CM

## 2020-05-17 DIAGNOSIS — Z5111 Encounter for antineoplastic chemotherapy: Secondary | ICD-10-CM | POA: Diagnosis not present

## 2020-05-17 MED ORDER — HEPARIN SOD (PORK) LOCK FLUSH 100 UNIT/ML IV SOLN
500.0000 [IU] | INTRAVENOUS | Status: AC | PRN
  Administered 2020-05-17: 500 [IU]
  Filled 2020-05-17: qty 5

## 2020-05-17 MED ORDER — SODIUM CHLORIDE 0.9% FLUSH
10.0000 mL | INTRAVENOUS | Status: AC | PRN
  Administered 2020-05-17: 10 mL
  Filled 2020-05-17: qty 10

## 2020-05-17 MED ORDER — SODIUM CHLORIDE 0.9 % IV SOLN
Freq: Once | INTRAVENOUS | Status: AC
  Filled 2020-05-17: qty 250

## 2020-05-17 MED ORDER — FAMOTIDINE 20 MG PO TABS
ORAL_TABLET | ORAL | Status: AC
  Filled 2020-05-17: qty 1

## 2020-05-17 MED ORDER — FAMOTIDINE 20 MG PO TABS
20.0000 mg | ORAL_TABLET | Freq: Once | ORAL | Status: AC
  Administered 2020-05-17: 20 mg via ORAL

## 2020-05-17 MED ORDER — FAMOTIDINE IN NACL 20-0.9 MG/50ML-% IV SOLN: 20.0000 mg | Freq: Once | INTRAVENOUS | Status: DC

## 2020-05-17 NOTE — Progress Notes (Signed)
Patient c/o nausea while receiving IVFs. Declined ginger ale/saltines. Aromatherapy offered and patient agreed. Educated patient on the benefits of aromatherapy and how to use inhalation device. This RN provided patient with peppermint and orange ginger inhalers. Patient chose to use peppermint inhaler at this time. Verbalized improvement in nausea; however, began c/o "gas and indigestion." Dr. Julien Nordmann notified via phone and orders received, repeated, and confirmed. At time of discharge, patient verbalized improvement in symptoms.

## 2020-05-17 NOTE — Patient Instructions (Signed)
Rehydration, Adult Rehydration is the replacement of body fluids and salts and minerals (electrolytes) that are lost during dehydration. Dehydration is when there is not enough fluid or water in the body. This happens when you lose more fluids than you take in. Common causes of dehydration include:  Vomiting.  Diarrhea.  Excessive sweating, such as from heat exposure or exercise.  Taking medicines that cause the body to lose excess fluid (diuretics).  Impaired kidney function.  Not drinking enough fluid.  Certain illnesses or infections.  Certain poorly controlled long-term (chronic) illnesses, such as diabetes, heart disease, and kidney disease.  Symptoms of mild dehydration may include thirst, dry lips and mouth, dry skin, and dizziness. Symptoms of severe dehydration may include increased heart rate, confusion, fainting, and not urinating. You can rehydrate by drinking certain fluids or getting fluids through an IV tube, as told by your health care provider. What are the risks? Generally, rehydration is safe. However, one problem that can happen is taking in too much fluid (overhydration). This is rare. If overhydration happens, it can cause an electrolyte imbalance, kidney failure, or a decrease in salt (sodium) levels in the body. How to rehydrate Follow instructions from your health care provider for rehydration. The kind of fluid you should drink and the amount you should drink depend on your condition.  If directed by your health care provider, drink an oral rehydration solution (ORS). This is a drink designed to treat dehydration that is found in pharmacies and retail stores. ? Make an ORS by following instructions on the package. ? Start by drinking small amounts, about  cup (120 mL) every 5-10 minutes. ? Slowly increase how much you drink until you have taken the amount recommended by your health care provider.  Drink enough clear fluids to keep your urine clear or pale  yellow. If you were instructed to drink an ORS, finish the ORS first, then start slowly drinking other clear fluids. Drink fluids such as: ? Water. Do not drink only water. Doing that can lead to having too little sodium in your body (hyponatremia). ? Ice chips. ? Fruit juice that you have added water to (diluted juice). ? Low-calorie sports drinks.  If you are severely dehydrated, your health care provider may recommend that you receive fluids through an IV tube in the hospital.  Do not take sodium tablets. Doing that can lead to the condition of having too much sodium in your body (hypernatremia). Eating while you rehydrate Follow instructions from your health care provider about what to eat while you rehydrate. Your health care provider may recommend that you slowly begin eating regular foods in small amounts.  Eat foods that contain a healthy balance of electrolytes, such as bananas, oranges, potatoes, tomatoes, and spinach.  Avoid foods that are greasy or contain a lot of fat or sugar.  In some cases, you may get nutrition through a feeding tube that is passed through your nose and into your stomach (nasogastric tube, or NG tube). This may be done if you have uncontrolled vomiting or diarrhea. Beverages to avoid Certain beverages may make dehydration worse. While you rehydrate, avoid:  Alcohol.  Caffeine.  Drinks that contain a lot of sugar. These include: ? High-calorie sports drinks. ? Fruit juice that is not diluted. ? Soda.  Check nutrition labels to see how much sugar or caffeine a beverage contains. Signs of dehydration recovery You may be recovering from dehydration if:  You are urinating more often than before you started   rehydrating.  Your urine is clear or pale yellow.  Your energy level improves.  You vomit less frequently.  You have diarrhea less frequently.  Your appetite improves or returns to normal.  You feel less dizzy or less light-headed.  Your  skin tone and color start to look more normal. Contact a health care provider if:  You continue to have symptoms of mild dehydration, such as: ? Thirst. ? Dry lips. ? Slightly dry mouth. ? Dry, warm skin. ? Dizziness.  You continue to vomit or have diarrhea. Get help right away if:  You have symptoms of dehydration that get worse.  You feel: ? Confused. ? Weak. ? Like you are going to faint.  You have not urinated in 6-8 hours.  You have very dark urine.  You have trouble breathing.  Your heart rate while sitting still is over 100 beats a minute.  You cannot drink fluids without vomiting.  You have vomiting or diarrhea that: ? Gets worse. ? Does not go away.  You have a fever. This information is not intended to replace advice given to you by your health care provider. Make sure you discuss any questions you have with your health care provider. Document Revised: 11/04/2017 Document Reviewed: 01/16/2016 Elsevier Patient Education  2020 Else[employer]   Nausea, Adult Nausea is feeling sick to your stomach or feeling that you are about to throw up (vomit). Feeling sick to your stomach is usually not serious, but it may be an early sign of a more serious medical problem. As you feel sicker to your stomach, you may throw up. If you throw up, or if you are not able to drink enough fluids, there is a risk that you may lose too much water in your body (get dehydrated). If you lose too much water in your body, you may:  Feel tired.  Feel thirsty.  Have a dry mouth.  Have cracked lips.  Go pee (urinate) less often. Older adults and people who have other diseases or a weak body defense system (immune system) have a higher risk of losing too much water in the body. The main goals of treating this condition are:  To relieve your nausea.  To ensure your nausea occurs less often.  To prevent throwing up and losing too much fluid. Follow these instructions at home: Watch  your symptoms for any changes. Tell your doctor about them. Follow these instructions as told by your doctor. Eating and drinking      Take an ORS (oral rehydration solution). This is a drink that is sold at pharmacies and stores.  Drink clear fluids in small amounts as you are able. These include: ? Water. ? Ice chips. ? Fruit juice that has water added (diluted fruit juice). ? Low-calorie sports drinks.  Eat bland, easy-to-digest foods in small amounts as you are able, such as: ? Bananas. ? Applesauce. ? Rice. ? Low-fat (lean) meats. ? Toast. ? Crackers.  Avoid drinking fluids that have a lot of sugar or caffeine in them. This includes energy drinks, sports drinks, and soda.  Avoid alcohol.  Avoid spicy or fatty foods. General instructions  Take over-the-counter and prescription medicines only as told by your doctor.  Rest at home while you get better.  Drink enough fluid to keep your pee (urine) pale yellow.  Take slow and deep breaths when you feel sick to your stomach.  Avoid food or things that have strong smells.  Wash your hands often with soap   and water. If you cannot use soap and water, use hand sanitizer.  Make sure that all people in your home wash their hands well and often.  Keep all follow-up visits as told by your doctor. This is important. Contact a doctor if:  You feel sicker to your stomach.  You feel sick to your stomach for more than 2 days.  You throw up.  You are not able to drink fluids without throwing up.  You have new symptoms.  You have a fever.  You have a headache.  You have muscle cramps.  You have a rash.  You have pain while peeing.  You feel light-headed or dizzy. Get help right away if:  You have pain in your chest, neck, arm, or jaw.  You feel very weak or you pass out (faint).  You have throw up that is bright red or looks like coffee grounds.  You have bloody or black poop (stools) or poop that looks like  tar.  You have a very bad headache, a stiff neck, or both.  You have very bad pain, cramping, or bloating in your belly (abdomen).  You have trouble breathing or you are breathing very quickly.  Your heart is beating very quickly.  Your skin feels cold and clammy.  You feel confused.  You have signs of losing too much water in your body, such as: ? Dark pee, very little pee, or no pee. ? Cracked lips. ? Dry mouth. ? Sunken eyes. ? Sleepiness. ? Weakness. These symptoms may be an emergency. Do not wait to see if the symptoms will go away. Get medical help right away. Call your local emergency services (911 in the [place] Do not drive yourself to the hospital. Summary  Nausea is feeling sick to your stomach or feeling that you are about to throw up (vomit).  If you throw up, or if you are not able to drink enough fluids, there is a risk that you may lose too much water in your body (get dehydrated).  Eat and drink what your doctor tells you. Take over-the-counter and prescription medicines only as told by your doctor.  Contact a doctor right away if your symptoms get worse or you have new symptoms.  Keep all follow-up visits as told by your doctor. This is important. This information is not intended to replace advice given to you by your health care provider. Make sure you discuss any questions you have with your health care provider. Document Revised: 05/02/2018 Document Reviewed: 05/02/2018 Elsevier Patient Education  2020 Elsevier Inc.  

## 2020-05-19 ENCOUNTER — Other Ambulatory Visit: Payer: Self-pay

## 2020-05-19 ENCOUNTER — Ambulatory Visit (HOSPITAL_COMMUNITY)
Admission: RE | Admit: 2020-05-19 | Discharge: 2020-05-19 | Disposition: A | Discharge status: Home / Self Care | Payer: Medicare HMO | Source: Ambulatory Visit | Attending: Oncology | Admitting: Oncology

## 2020-05-19 ENCOUNTER — Telehealth: Payer: Self-pay | Admitting: Nurse Practitioner

## 2020-05-19 DIAGNOSIS — C7951 Secondary malignant neoplasm of bone: Secondary | ICD-10-CM

## 2020-05-19 DIAGNOSIS — Z170 Estrogen receptor positive status [ER+]: Secondary | ICD-10-CM | POA: Diagnosis present

## 2020-05-19 DIAGNOSIS — J438 Other emphysema: Secondary | ICD-10-CM

## 2020-05-19 DIAGNOSIS — D696 Thrombocytopenia, unspecified: Secondary | ICD-10-CM | POA: Insufficient documentation

## 2020-05-19 DIAGNOSIS — C50912 Malignant neoplasm of unspecified site of left female breast: Secondary | ICD-10-CM | POA: Diagnosis present

## 2020-05-19 DIAGNOSIS — I700 Atherosclerosis of aorta: Secondary | ICD-10-CM | POA: Diagnosis present

## 2020-05-19 DIAGNOSIS — C50812 Malignant neoplasm of overlapping sites of left female breast: Secondary | ICD-10-CM | POA: Diagnosis not present

## 2020-05-19 MED ORDER — GADOBUTROL 1 MMOL/ML IV SOLN
5.0000 mL | Freq: Once | INTRAVENOUS | Status: AC | PRN
  Administered 2020-05-19: 5 mL via INTRAVENOUS

## 2020-05-19 NOTE — Telephone Encounter (Signed)
I called Ms. Szatkowski to f/u on a message regarding side effects to Cipro. She started Friday 6/11. She tolerated the first pill but developed vomiting after the second dose. She is also reporting wheezing, dry cough and exertional dyspnea which she thinks might be getting worse. Denies fever or chills. I recommend for her to seek ED evaluation given that her respiratory status has changed in setting of recent neutropenia. She feels like these symptoms are stable for her. She has lab and f/u at [facility] tomorrow 6/15, she is aware. She knows to go to ED if she has worsening concerns overnight. She understands instructions and appreciates the call.   [name], NP

## 2020-05-20 ENCOUNTER — Telehealth: Payer: Self-pay | Admitting: Oncology

## 2020-05-20 ENCOUNTER — Inpatient Hospital Stay: Payer: Medicare HMO

## 2020-05-20 ENCOUNTER — Encounter: Payer: Self-pay | Admitting: Licensed Clinical Social Worker

## 2020-05-20 ENCOUNTER — Inpatient Hospital Stay (HOSPITAL_BASED_OUTPATIENT_CLINIC_OR_DEPARTMENT_OTHER): Payer: Medicare HMO | Admitting: Oncology

## 2020-05-20 ENCOUNTER — Other Ambulatory Visit: Payer: Self-pay

## 2020-05-20 ENCOUNTER — Inpatient Hospital Stay (HOSPITAL_BASED_OUTPATIENT_CLINIC_OR_DEPARTMENT_OTHER): Payer: Medicare HMO

## 2020-05-20 ENCOUNTER — Other Ambulatory Visit: Payer: Self-pay | Admitting: *Deleted

## 2020-05-20 ENCOUNTER — Inpatient Hospital Stay: Payer: Medicare HMO | Admitting: Licensed Clinical Social Worker

## 2020-05-20 ENCOUNTER — Other Ambulatory Visit: Payer: Self-pay | Admitting: Oncology

## 2020-05-20 VITALS — BP 118/72 | HR 90 | Temp 97.7°F | Resp 18

## 2020-05-20 DIAGNOSIS — C7951 Secondary malignant neoplasm of bone: Secondary | ICD-10-CM

## 2020-05-20 DIAGNOSIS — C50812 Malignant neoplasm of overlapping sites of left female breast: Secondary | ICD-10-CM

## 2020-05-20 DIAGNOSIS — C50412 Malignant neoplasm of upper-outer quadrant of left female breast: Secondary | ICD-10-CM | POA: Diagnosis not present

## 2020-05-20 DIAGNOSIS — E860 Dehydration: Secondary | ICD-10-CM

## 2020-05-20 DIAGNOSIS — Z79811 Long term (current) use of aromatase inhibitors: Secondary | ICD-10-CM

## 2020-05-20 DIAGNOSIS — D696 Thrombocytopenia, unspecified: Secondary | ICD-10-CM

## 2020-05-20 DIAGNOSIS — C50912 Malignant neoplasm of unspecified site of left female breast: Secondary | ICD-10-CM

## 2020-05-20 DIAGNOSIS — Z5111 Encounter for antineoplastic chemotherapy: Secondary | ICD-10-CM | POA: Diagnosis not present

## 2020-05-20 DIAGNOSIS — Z170 Estrogen receptor positive status [ER+]: Secondary | ICD-10-CM

## 2020-05-20 DIAGNOSIS — Z95828 Presence of other vascular implants and grafts: Secondary | ICD-10-CM

## 2020-05-20 DIAGNOSIS — J438 Other emphysema: Secondary | ICD-10-CM

## 2020-05-20 DIAGNOSIS — D649 Anemia, unspecified: Secondary | ICD-10-CM

## 2020-05-20 DIAGNOSIS — C773 Secondary and unspecified malignant neoplasm of axilla and upper limb lymph nodes: Secondary | ICD-10-CM

## 2020-05-20 DIAGNOSIS — I700 Atherosclerosis of aorta: Secondary | ICD-10-CM

## 2020-05-20 LAB — CBC WITH DIFFERENTIAL/PLATELET
Abs Immature Granulocytes: 0.01 10*3/uL (ref 0.00–0.07)
Abs Immature Granulocytes: 0.02 10*3/uL (ref 0.00–0.07)
Basophils Absolute: 0 10*3/uL (ref 0.0–0.1)
Basophils Absolute: 0 10*3/uL (ref 0.0–0.1)
Basophils Relative: 0 %
Basophils Relative: 0 %
Eosinophils Absolute: 0 10*3/uL (ref 0.0–0.5)
Eosinophils Absolute: 0 10*3/uL (ref 0.0–0.5)
Eosinophils Relative: 0 %
Eosinophils Relative: 0 %
HCT: 27.5 % — ABNORMAL LOW (ref 36.0–46.0)
HCT: 25.1 % — ABNORMAL LOW (ref 36.0–46.0)
Hemoglobin: 8.7 g/dL — ABNORMAL LOW (ref 12.0–15.0)
Hemoglobin: 8.1 g/dL — ABNORMAL LOW (ref 12.0–15.0)
Immature Granulocytes: 0 %
Immature Granulocytes: 0 %
Lymphocytes Relative: 92 %
Lymphocytes Relative: 94 %
Lymphs Abs: 5.5 10*3/uL — ABNORMAL HIGH (ref 0.7–4.0)
Lymphs Abs: 5.9 10*3/uL — ABNORMAL HIGH (ref 0.7–4.0)
MCH: 28.2 pg (ref 26.0–34.0)
MCH: 29 pg (ref 26.0–34.0)
MCHC: 31.6 g/dL (ref 30.0–36.0)
MCHC: 32.3 g/dL (ref 30.0–36.0)
MCV: 89 fL (ref 80.0–100.0)
MCV: 90 fL (ref 80.0–100.0)
Monocytes Absolute: 0.1 10*3/uL (ref 0.1–1.0)
Monocytes Absolute: 0.1 10*3/uL (ref 0.1–1.0)
Monocytes Relative: 1 %
Monocytes Relative: 1 %
Neutro Abs: 0.4 10*3/uL — CL (ref 1.7–7.7)
Neutro Abs: 0.3 10*3/uL — CL (ref 1.7–7.7)
Neutrophils Relative %: 7 %
Neutrophils Relative %: 5 %
Platelets: <5 10*3/uL — CL (ref 150–400)
Platelets: 80 10*3/uL — ABNORMAL LOW (ref 150–400)
RBC: 3.09 MIL/uL — ABNORMAL LOW (ref 3.87–5.11)
RBC: 2.79 MIL/uL — ABNORMAL LOW (ref 3.87–5.11)
RDW: 18.6 % — ABNORMAL HIGH (ref 11.5–15.5)
RDW: 18.5 % — ABNORMAL HIGH (ref 11.5–15.5)
WBC: 6.3 10*3/uL (ref 4.0–10.5)
WBC: 6.5 10*3/uL (ref 4.0–10.5)
nRBC: 2.1 % — ABNORMAL HIGH (ref 0.0–0.2)
nRBC: 1.9 % — ABNORMAL HIGH (ref 0.0–0.2)

## 2020-05-20 LAB — COMPREHENSIVE METABOLIC PANEL
ALT: 41 U/L (ref 0–44)
AST: 189 U/L (ref 15–41)
Albumin: 2.3 g/dL — ABNORMAL LOW (ref 3.5–5.0)
Alkaline Phosphatase: 209 U/L — ABNORMAL HIGH (ref 38–126)
Anion gap: 11 (ref 5–15)
BUN: 7 mg/dL (ref 6–20)
CO2: 24 mmol/L (ref 22–32)
Calcium: 8.2 mg/dL — ABNORMAL LOW (ref 8.9–10.3)
Chloride: 114 mmol/L — ABNORMAL HIGH (ref 98–111)
Creatinine, Ser: 0.97 mg/dL (ref 0.44–1.00)
GFR calc Af Amer: >60 mL/min (ref >60)
GFR calc non Af Amer: >60 mL/min (ref >60)
Glucose, Bld: 92 mg/dL (ref 70–99)
Potassium: 3.9 mmol/L (ref 3.5–5.1)
Sodium: 149 mmol/L — ABNORMAL HIGH (ref 135–145)
Total Bilirubin: 0.4 mg/dL (ref 0.3–1.2)
Total Protein: 5.3 g/dL — ABNORMAL LOW (ref 6.5–8.1)

## 2020-05-20 LAB — TYPE AND SCREEN
ABO/RH(D): O POS
Antibody Screen: NEGATIVE

## 2020-05-20 MED ORDER — FILGRASTIM-SNDZ 300 MCG/0.5ML IJ SOSY
PREFILLED_SYRINGE | INTRAMUSCULAR | Status: AC
  Filled 2020-05-20: qty 0.5

## 2020-05-20 MED ORDER — FILGRASTIM-SNDZ 300 MCG/0.5ML IJ SOSY
300.0000 ug | PREFILLED_SYRINGE | Freq: Once | INTRAMUSCULAR | Status: AC
  Administered 2020-05-20: 300 ug via SUBCUTANEOUS

## 2020-05-20 MED ORDER — SODIUM CHLORIDE 0.9% IV SOLUTION
250.0000 mL | Freq: Once | INTRAVENOUS | Status: AC
  Administered 2020-05-20: 250 mL via INTRAVENOUS
  Filled 2020-05-20: qty 250

## 2020-05-20 MED ORDER — SODIUM CHLORIDE 0.9 % IV SOLN
INTRAVENOUS | Status: AC
  Filled 2020-05-20: qty 250

## 2020-05-20 MED ORDER — DIPHENHYDRAMINE HCL 25 MG PO CAPS
ORAL_CAPSULE | ORAL | Status: AC
  Filled 2020-05-20: qty 1

## 2020-05-20 MED ORDER — HEPARIN SOD (PORK) LOCK FLUSH 100 UNIT/ML IV SOLN
500.0000 [IU] | Freq: Once | INTRAVENOUS | Status: AC
  Administered 2020-05-20: 500 [IU]
  Filled 2020-05-20: qty 5

## 2020-05-20 MED ORDER — ACETAMINOPHEN 325 MG PO TABS
650.0000 mg | ORAL_TABLET | Freq: Once | ORAL | Status: AC
  Administered 2020-05-20: 650 mg via ORAL

## 2020-05-20 MED ORDER — SODIUM CHLORIDE 0.9% FLUSH
10.0000 mL | Freq: Once | INTRAVENOUS | Status: AC
  Administered 2020-05-20: 10 mL
  Filled 2020-05-20: qty 10

## 2020-05-20 MED ORDER — ZOLEDRONIC ACID 4 MG/5ML IV CONC
3.5000 mg | Freq: Once | INTRAVENOUS | Status: AC
  Administered 2020-05-20: 3.5 mg via INTRAVENOUS
  Filled 2020-05-20: qty 4.38

## 2020-05-20 MED ORDER — ZOLEDRONIC ACID 4 MG/100ML IV SOLN
INTRAVENOUS | Status: AC
  Filled 2020-05-20: qty 100

## 2020-05-20 MED ORDER — DIPHENHYDRAMINE HCL 25 MG PO CAPS
25.0000 mg | ORAL_CAPSULE | Freq: Once | ORAL | Status: AC
  Administered 2020-05-20: 25 mg via ORAL

## 2020-05-20 MED ORDER — ACETAMINOPHEN 325 MG PO TABS
ORAL_TABLET | ORAL | Status: AC
  Filled 2020-05-20: qty 2

## 2020-05-20 NOTE — Telephone Encounter (Signed)
Scheduled appt per 6/15 sch message - pt is aware of appts.

## 2020-05-20 NOTE — Progress Notes (Signed)
[place]  [phone] [phone]     ID: Jamie Hartman DOB: 04/02/1961  MR#: [id number]  CHE#:[id number]  Patient Care Team: Edmonia James, PA-C as PCP - General (Physician Assistant) Beverely Pace, MD as Referring Physician (Surgical Oncology) Derrell Milanes, Virgie Dad, MD as Consulting Physician (Oncology) Chauncey Cruel, MD OTHER MD:  CHIEF COMPLAINT: Invasive lobular breast cancer, stage IV, estrogen receptor positive; pancytopenia  CURRENT TREATMENT: Fulvestrant [and palbociclib]; zoledronate   INTERVAL HISTORY: Jamie Hartman returns today for follow up of her triple negative breast cancer accompanied by her husband Joe. She was initially evaluated in the breast cancer clinic here on 04/17/2020.  Since consultation, she underwent [facility] PET scan on 04/28/2020 showing: intense multifocal estrogen positive breast cancer within axillary and appendicular skeleton; while there is diffuse sclerotic metastasis throughout the spine, half of vertebral bodies have intense radiotracer activity; intense radiotracer activity associated with small mediastinal and right hilar lymph nodes, difficult to define by CT imaging; indeterminate left cerebral hemisphere lesiont; liver cannot be well assessed; no other visceral metastasis identified.  She is having significant nausea and headaches and a brain MRI has been scheduled for May 28, 2020 to clarify that issue.  She started fulvestrant May 01, 2020.  She received a second dose May 15, 2020.  She was supposed to have received zoledronate 05/15/2020 but there was a bump in her creatinine so was postponed until today.  She underwent liver MRI on 05/06/2020--this showed no evidence of metastatic disease to the liver.   REVIEW OF SYSTEMS: Jamie Hartman continues to be significantly anemic.  She has very little energy, but has not fainted or fallen.  She does have palpitations and can be short of breath with moderate activity.   There has been no overt bleeding despite her thrombocytopenia.  She did have a mild epistaxis x1 according to her husband.  She was started on prophylactic ciprofloxacin for her neutropenia.  She was not able to take both pills and actually stopped them when the nighttime pill caused her nausea.  She has had no intercurrent fevers.  A detailed review of systems today was otherwise stable.  HISTORY OF CURRENT ILLNESS: From the original intake note:  Jamie Hartman 's breast cancer history dates back to February 2018 when she noted left nipple retraction.  Her subsequent evaluation and treatment is detailed below, but basically she received neoadjuvant chemotherapy with docetaxel doxorubicin and cyclophosphamide and proceeded to mastectomy which unfortunately showed T3 N2 residual disease, with all 10 axillary lymph nodes removed positive.  She received adjuvant radiation and then was started on anastrozole.  More recently the had a CT scan of the chest with contrast to evaluate her ongoing left-sided chest wall pain.  This showed several new sclerotic lesions in the thoracic vertebrae but no visceral disease.  PET scan 02/18/2020 showed again no visceral uptake but widespread bony metastatic disease through the axial and appendicular skeleton.  Thoracic and lumbar spine MRI confirmed multiple areas of metastatic disease to bone without evidence of neural impingement.  On 03/28/2020 the patient underwent CT-guided biopsy of the right iliac bone, and this showed ([id number] [phone]) metastatic carcinoma consistent with lobular breast cancer, but now triple negative, with an MIB-1 of 20%.  Her subsequent history is as detailed below   PAST MEDICAL HISTORY: Past Medical History:  Diagnosis Date  . Breast cancer ([place]) 01/2017   metastatic lobular carcinoma    PAST SURGICAL HISTORY: Past Surgical History:  Procedure Laterality Date  .  BILATERAL TOTAL MASTECTOMY WITH AXILLARY LYMPH NODE DISSECTION   09/04/2017  . LEEP      FAMILY HISTORY: Family History  Problem Relation Age of Onset  . Emphysema Mother   . Diabetes Father   . Breast cancer Maternal Aunt   . Breast cancer Paternal Aunt   . Pancreatic cancer Maternal Grandmother   The patient's father died at 25 from complications of diabetes.  He had a sister diagnosed with breast cancer.  The patient's mother died at 16 from complications of emphysema.  Her mother, the patient's maternal grandmother, had pancreatic cancer and a maternal aunt had a history of breast cancer.  The patient herself has a brother, with no history of cancer.  She has no sisters.   GYNECOLOGIC HISTORY:  No LMP recorded. Patient is postmenopausal. Menarche: 59 years old GX P 0 LMP 50 HRT no  Hysterectomy?  No BSO?  No   SOCIAL HISTORY: (updated May 2021)  Jamie Hartman lives with her husband Wille Glaser and 4 cats.  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    ADVANCED DIRECTIVES: In the absence of any document to the contrary the patient's husband is her healthcare power of attorney.   HEALTH MAINTENANCE: Social History   Tobacco Use  . Smoking status: Current Every Day Smoker    Packs/day: 0.50    Types: Cigarettes  . Smokeless tobacco: Never Used  Substance Use Topics  . Alcohol use: Yes  . Drug use: Not on file     Colonoscopy: 2020  PAP: Up-to-date  Bone density: 2019   Allergies  Allergen Reactions  . Pantoprazole Nausea Only, Other (See Comments) and Shortness Of Breath    Leg cramps, headaches Leg cramps, headaches Leg cramps, headaches Leg cramps, headaches   . Adhesive  [Tape] Dermatitis    Band aids  . Avelox [Moxifloxacin Hcl In Nacl]     Severe headache  . Ciprofloxacin Rash    Current Outpatient Medications  Medication Sig Dispense Refill  . cholecalciferol (VITAMIN D3) 25 MCG (1000 UNIT) tablet Take 1 tablet (1,000 Units total) by mouth daily.    . ciprofloxacin (CIPRO) 500 MG tablet Take 1 tablet (500 mg total) by mouth 2  (two) times daily. 14 tablet 0  . hydrOXYzine (ATARAX/VISTARIL) 25 MG tablet Take 1 tablet (25 mg total) by mouth at bedtime and may repeat dose one time if needed. 60 tablet 0  . mirtazapine (REMERON) 7.5 MG tablet Take 1 tablet (7.5 mg total) by mouth at bedtime. 30 tablet 1  . ondansetron (ZOFRAN-ODT) 8 MG disintegrating tablet Take by mouth.    . palbociclib (IBRANCE) 125 MG tablet Take 1 tablet (125 mg total) by mouth daily. Take for 21 days on, 7 days off, repeat every 28 days. 21 tablet 0  . prochlorperazine (COMPAZINE) 10 MG tablet Take by mouth.    . promethazine (PHENERGAN) 25 MG suppository Place 1 suppository (25 mg total) rectally every 6 (six) hours as needed for nausea or vomiting. 12 each 3  . valACYclovir (VALTREX) 500 MG tablet 1 tablet bid x 3 days    . Zoledronic Acid (ZOMETA) 4 MG/100ML IVPB Inject into the vein.     No current facility-administered medications for this visit.    OBJECTIVE: White woman examined in a recliner  There were no vitals filed for this visit.   There is no height or weight on file to calculate BMI.   Wt Readings from Last 3 Encounters:  05/15/20 120 lb 14.4 oz (54.8  kg)  05/01/20 122 lb 3.2 oz (55.4 kg)  04/17/20 128 lb 1.6 oz (58.1 kg)  For vitals on the May 20, 2020 visit please see the symptom clinic flowsheet    ECOG FS:2 - Symptomatic, <50% confined to bed  Sclerae unicteric, EOMs intact Wearing a mask No cervical or supraclavicular adenopathy Lungs no rales or rhonchi, no wheezes Heart rapid rate, regular rhythm Abd soft, positive bowel sounds, no masses palpated Neuro: nonfocal, well oriented, positive affect Breasts: Deferred   LAB RESULTS:  CMP     Component Value Date/Time   NA 149 (H) 05/20/2020 0950   K 3.9 05/20/2020 0950   CL 114 (H) 05/20/2020 0950   CO2 24 05/20/2020 0950   GLUCOSE 92 05/20/2020 0950   BUN 7 05/20/2020 0950   CREATININE 0.97 05/20/2020 0950   CREATININE 0.90 05/01/2020 1523   CALCIUM 8.2  (L) 05/20/2020 0950   PROT 5.3 (L) 05/20/2020 0950   ALBUMIN 2.3 (L) 05/20/2020 0950   AST 189 (HH) 05/20/2020 0950   AST 561 (HH) 05/01/2020 1523   ALT 41 05/20/2020 0950   ALT 326 (HH) 05/01/2020 1523   ALKPHOS 209 (H) 05/20/2020 0950   BILITOT 0.4 05/20/2020 0950   BILITOT 0.8 05/01/2020 1523   GFRNONAA >60 05/20/2020 0950   GFRNONAA >60 05/01/2020 1523   GFRAA >60 05/20/2020 0950   GFRAA >60 05/01/2020 1523    No results found for: TOTALPROTELP, ALBUMINELP, A1GS, A2GS, BETS, BETA2SER, GAMS, MSPIKE, SPEI  Lab Results  Component Value Date   WBC 6.5 05/20/2020   NEUTROABS 0.3 (LL) 05/20/2020   HGB 8.1 (L) 05/20/2020   HCT 25.1 (L) 05/20/2020   MCV 90.0 05/20/2020   PLT 80 (L) 05/20/2020    No results found for: LABCA2  No components found for: [id number]  No results for input(s): INR in the last 168 hours.  No results found for: LABCA2  No results found for: YBO175  No results found for: ZWC585  Lab Results  Component Value Date   IDP824 9,870.0 (H) 05/01/2020    No results found for: CA2729  No components found for: HGQUANT  No results found for: CEA1 / No results found for: CEA1   No results found for: AFPTUMOR  No results found for: CHROMOGRNA  No results found for: KPAFRELGTCHN, LAMBDASER, KAPLAMBRATIO (kappa/lambda light chains)  No results found for: HGBA, HGBA2QUANT, HGBFQUANT, HGBSQUAN (Hemoglobinopathy evaluation)   No results found for: LDH  No results found for: IRON, TIBC, IRONPCTSAT (Iron and TIBC)  No results found for: FERRITIN  Urinalysis    Component Value Date/Time   BILIRUBINUR negative 03/04/2016 1453   KETONESUR trace (5) (A) 03/04/2016 1453   PROTEINUR negative 03/04/2016 1453   UROBILINOGEN 0.2 03/04/2016 1453   NITRITE Negative 03/04/2016 1453   LEUKOCYTESUR Negative 03/04/2016 1453     STUDIES: MR LIVER W WO CONTRAST  Result Date: 05/19/2020 CLINICAL DATA:  Lobular breast cancer. Elevated liver function  tests. Evaluate for liver metastasis. EXAM: MRI ABDOMEN WITHOUT AND WITH CONTRAST TECHNIQUE: Multiplanar multisequence MR imaging of the abdomen was performed both before and after the administration of intravenous contrast. CONTRAST:  78m GADAVIST GADOBUTROL 1 MMOL/ML IV SOLN COMPARISON:  [facility] PET 02/18/2020. [facility] 03/17/2017 abdominopelvic CT. FINDINGS: Portions of exam are mildly motion degraded. Lower chest: Moderate left and small right pleural effusions. Normal heart size. Bilateral breast implants. Hepatobiliary: High left hepatic lobe 8 mm cyst, present back 03/17/2017. No suspicious liver lesion. No significant  stenosis. Normal gallbladder, without biliary ductal dilatation. Pancreas:  Normal, without mass or ductal dilatation. Spleen:  Borderline splenomegaly, 12.9 cm craniocaudal. Adrenals/Urinary Tract: Normal adrenal glands. Normal kidneys, without hydronephrosis. Stomach/Bowel: Normal stomach and abdominal bowel loops. Vascular/Lymphatic: Aortic atherosclerosis. No retroperitoneal or retrocrural adenopathy. Other:  No ascites. Musculoskeletal: Relatively diffuse osseous metastasis, as evidenced by heterogeneous enhancement throughout. IMPRESSION: 1. Mildly motion degraded exam. 2. Given this limitation, no evidence of hepatic metastasis or explanation for elevated liver function tests. 3. Widespread osseous metastasis. 4. Left larger than right pleural effusions. 5.  Aortic Atherosclerosis (ICD10-I70.0). Electronically Signed   By: Abigail Miyamoto M.D.   On: 05/19/2020 14:09   NM PET ([facility]) WHOLE BODY  Result Date: 04/28/2020 CLINICAL DATA:  Metastatic breast carcinoma. Bone metastasis on FDG PET scan. Lobular carcinoma status post mastectomies chemotherapy and adjuvant therapy. T3 disease with nodal metastasis. Initial ER positive pathology. Subsequent biopsy suggest triple negative phenotype. EXAM: NUCLEAR MEDICINE PET WHOLE BODY TECHNIQUE: 5.7 mCi F-18 Estradiol was  injected intravenously. Full-ring PET imaging was performed from the vertex to feet after the radiotracer. CT data was obtained and used for attenuation correction and anatomic localization. COMPARISON:  FDG PET scan 02/18/2020, CT 02/21/2020 FINDINGS: Mediastinal blood pool activity: 3.7 HEAD/NECK: Focus of intense uptake at the LEFT posterior vertex is favored within the skull. However, more inferiorly in the LEFT cerebral parenchyma medial to the ventricular atria there is a focus of moderate radiotracer activity with SUV max equal 5.1. (Image 15 of fused data set) Incidental CT findings: None CHEST: There is intense activity associated with small mediastinal lymph nodes including subcarinal lymph nodes, prevascular lymph nodes and RIGHT hilar lymph node. These lymph nodes are small and difficult define on CT. Subcarinal activity is intense with SUV max equal 14.3. RIGHT hilar activity also intense with SUV max equal 14.8. There is mild activity associated with pleuroparenchymal thickening in the LEFT upper lobe at radiation treatment site. Incidental CT findings: Small LEFT effusion. ABDOMEN/PELVIS: No clear abnormal activity liver however intense background activity within liver limits sensitivity for liver metastasis. No hypermetabolic abdominopelvic lymph nodes. No evidence of visceral metastasis otherwise. Incidental CT findings: Post hysterectomy.  No adnexal abnormality. SKELETON: The dominant finding is multifocal intense radiotracer activity within the skeleton. Example lesion in the RIGHT iliac bone adjacent to SI joint with SUV max equal 16.4. Example lesion in the L2 vertebral body with SUV max equal 18.9. While nearly every vertebral body has sclerotic metastasis approximately half of the vertebral bodies have intense radiotracer activity. For example lesion at L2 with SUV max equal 18.9. Lesion at T10 with SUV max equal 22.2. Lesions involve the appendicular skeleton including the RIGHT femoral head  with SUV max equal 14.8. Intense lesion in the RIGHT scapula adjacent the glenoid fossa with SUV max equal 20.8. Multiple foci at the skull base and cervical spine. Incidental CT findings: No pathologic fracture. Diffuse sclerotic metastasis EXTREMITIES: Proximal appendicular skeleton metastasis as above Incidental CT findings: None IMPRESSION: 1. Dominant finding is intense multifocal estrogen positive breast cancer within the axillary and appendicular skeleton. While there is diffuse sclerotic metastasis throughout the spine, approximately half of the vertebral bodies have intense radiotracer activity. 2. Intense radiotracer activity associated with small mediastinal and RIGHT hilar lymph nodes is concerning for estrogen positive breast cancer in the mediastinum. These lesions are very small and difficult to define by CT imaging. 3. Indeterminate lesion within the LEFT cerebral hemisphere. Recommend MRI brain with and without contrast for  further evaluation. Calvarial metastasis noted which could influence the above findings. 4. No visceral metastasis identified (other than the indeterminate lesion in the brain). Of note, sensitivity is reduced in liver due to intense background estrogen receptor positivity. Electronically Signed   By: Suzy Bouchard M.D.   On: 04/28/2020 17:25   This is additional information from Dr. Ilda Foil regarding the recent estradiol F-18 scan: Shared this Estradiol PET scan with the company medical imaging expert.  He agreed with our major findings of E2 positive disease in the bones and mediastinum. He felt the "brain" uptake was probably a skull lesion with motion artifact.   Interestingly, the activity associated with the left upper lobe radiation change is a know but poorly understood association of radiotracer uptake and radiation fibrosis (not inflammation).    Nicole Kindred     ELIGIBLE FOR AVAILABLE RESEARCH PROTOCOL: To be determined  ASSESSMENT: 59 y.o. [place]  woman with metastatic lobular breast cancer as follows:  (1) status post left breast overlapping and axillary lymph node biopsy 01/26/2017 for a clinical mT2 N1, stage IIA invasive lobular carcinoma, strongly estrogen and progesterone receptor positive, HER-2 not amplified  (2) treated neoadjuvantly with docetaxel, doxorubicin and cyclophosphamide for 6 cycles, between 03/10/2017 and 07/01/2017  (3) status post bilateral mastectomies 08/25/2017 showing residual invasive lobular carcinoma, ypT3 ypN3, stage IIIB  (4) status post adjuvant radiation to the left breast and axilla 11/30/2017 through 01/10/2018.  (5) anastrozole started 09/22/2017  METASTATIC DISEASE (6) CT scan of the chest 02/11/2020, PET scan 02/18/2020 and MRI of the thoracolumbar spine 03/12/2020 show multiple bone lesions (axial and appendicular) without neural impingement and without evidence of visceral disease  (a) CT-guided right iliac crest biopsy 03/28/2020 confirms metastatic invasive lobular breast cancer, now triple negative, with an MIB-1 of 20%  (b) F-18 estradiol "[facility]" PET scan 04/28/2020 shows stron estrogen avidity in bone lesions, questionable brain lesion, does not resolve issue of possible liver lesions  (c) liver MRI May 19, 2020 shows no evidence of metastatic disease to the liver.  (b) brain MRI May 28, 2020  (7) palbociclib and fulvestrant started 05/01/2020  (b) palbociclib held after 2 weeks because of pancytopenia  (8) zoledronate started 05/20/2020, to be repeated every 12 weeks  (9) pancytopenia, most likely secondary to bone marrow infiltration by tumor   PLAN: [place] had a platelet count of less than 5000 today.  We transfused a unit of platelets and obtained a 1 hour posttransfusion level of which was 80,000.  This tells [place] that we are not dealing with ITP or DIC or similar consumption thrombocytopenias but simply with a production defect.  She is also neutropenic and anemic.  Today we  went into the discussion of bone marrow function and blood cells in detail.  They understand there are 3 types of blood cells and all of them are low in her case.  Red cells carry oxygen.  She does not have enough red cells which is what we call anemia and that causes people to be short of breath, palpitations, feel faint or actually faint, and may cause chest pain or pressure.  She has received blood transfusions and we will continue to try to keep her hemoglobin above 8  The white cells also are low.  ANC today was 400.  We started her on prophylactic Cipro but she was not able to tolerate it twice daily because the nighttime dose made her nauseated.  I asked her to go ahead and continue to take the  morning Cipro.  I hope that this will allow [place] to avoid an intercurrent infection while we wait for her counts to recover  Finally she is thrombocytopenic.  We will transfuse as needed now that we know that she will respond to platelet transfusions  We are holding the palbociclib until her counts recover and at that time we will start at a much lower dose.  We are continuing the fulvestrant and her next dose is due May 29, 2020.  She will have had her brain MRI the day before.  She is returning 06 17 to check labs again.  I will add a possible transfusion that day and the next.  She will then return on 624 and again she may need a transfusion on that day.  Total encounter time 30 minutes.Sarajane Jews C. Bralin Garry, MD 05/20/2020 5:45 PM Medical Oncology and Hematology [facility] [place], [place] [address] Tel. [phone]    Fax. [phone]   This document serves as a record of services personally performed by Lurline Del, MD. It was created on his behalf by Wilburn Mylar, a trained medical scribe. The creation of this record is based on the scribe's personal observations and the provider's statements to them.   I, Lurline Del MD, have reviewed the above  documentation for accuracy and completeness, and I agree with the above.   *Total Encounter Time as defined by the Centers for Medicare and Medicaid Services includes, in addition to the face-to-face time of a patient visit (documented in the note above) non-face-to-face time: obtaining and reviewing outside history, ordering and reviewing medications, tests or procedures, care coordination (communications with other health care professionals or caregivers) and documentation in the medical record.

## 2020-05-20 NOTE — Progress Notes (Signed)
[facility] CSW Progress Note  Holiday representative met with patient and husband in exam room to further discuss available resources. Patient and husband report significant stress financially as they are still paying off bills from her original diagnosis in 2018 and now have co-pays from $25-$100 for each visit in addition to other fees. Additionally, they are driving further now since switching care to [facility]. Family income comes from [place] disability for patient and SS retirement for husband. He also tries to pick up small jobs to help supplement when he is not taking care of patient.  CSW discussed options available.  Signed up patient for [employer] and completed and submitted applications for [place]. Began application for [employer] [place] which patient and husband will finish and return to CSW to submit.   CSW will continue to follow for resource support. Family may also access food pantry as needed.    Samul Mcinroy E Kishana Battey LCSW, LCSW

## 2020-05-20 NOTE — Patient Instructions (Signed)

## 2020-05-20 NOTE — Progress Notes (Signed)
Pt received 500 ml NS IVF, 1 unit platelets, zometa, and zarxio injection today, tolerated all well.  VSS.  Declined anything to eat during tx, able to drink/ambulate to restroom w/out any issues.  Pt seen by MD Magrinat during infusion visit, as well as Social Work.  VO to recheck platelet count 1 hour peripherally after platelets are finished from MD [place].  Platelet count 80.  MD Magrinat made aware, VO to d/c patient.

## 2020-05-20 NOTE — Progress Notes (Incomplete)
[facility]  [phone] [phone]) [phone]     ID: Jamie Hartman DOB: 07-12-1961  MR#: [id number]  EYC#:[id number]  Patient Care Team: Edmonia James, PA-C as PCP - General (Physician Assistant) Beverely Pace, MD as Referring Physician (Surgical Oncology) Chadrick Sprinkle, Virgie Dad, MD as Consulting Physician (Oncology) Chauncey Cruel, MD OTHER MD:  CHIEF COMPLAINT: Invasive lobular breast cancer, stage IV  CURRENT TREATMENT: Fulvestrant; [palbociclib]; zoledronate   INTERVAL HISTORY: [name] returns today for follow up of her triple negative breast cancer accompanied by her husband Joe. She was evaluated in the breast cancer clinic 04/17/2020.  Since consultation, she underwent [facility] PET scan on 04/28/2020 showing: intense multifocal estrogen positive breast cancer within axillary and appendicular skeleton; while there is diffuse sclerotic metastasis throughout the spine, half of vertebral bodies have intense radiotracer activity; intense radiotracer activity associated with small mediastinal and right hilar lymph nodes, difficult to define by CT imaging; indeterminate left cerebral hemisphere lesiont; liver cannot be well assessed; no other visceral metastasis identified.  A liver MRI was obtained 05/19/2020 which showed no evidence of disease in the liver.  There were of course many bone lesions.  There were bilateral pleural effusions left greater than right.  She is having significant nausea and headaches and a brain MRI is scheduled for 06 23 2021  She started Faslodex 05/01/2020 and received a second dose 05/15/2020.  She is scheduled for third dose 05/29/2020 and then monthly.  She finds this painful but otherwise tolerable.  She was started on palbociclib at the same time but this test had to be held because of her severe cytopenias.  Her zoledronate on 05/15/2020 was held because of an increase in her creatinine.  She will receive a dose today.  We  are waiting on the repeat creatinine to decide.   REVIEW OF SYSTEMS: Mellonie was started on low-dose Remeron at the last visit.  She still has very little appetite but she thinks it may be a little bit better.  She was put on prophylactic Cipro 500 mg twice daily.  She tolerates the morning dose but with the evening dose she feels very nauseated so she stopped taking it.  She has had some mild epistaxis but no significant bruising and no overt bleeding.  She feels quite fatigued, has rare palpitations, and feels short of breath, but has not felt faint.  There have been no falls.  She has had no fevers.  A detailed review of systems today was otherwise stable   HISTORY OF CURRENT ILLNESS: From the original intake note:  Jamie Hartman 's breast cancer history dates back to February 2018 when she noted left nipple retraction.  Her subsequent evaluation and treatment is detailed below, but basically she received neoadjuvant chemotherapy with docetaxel doxorubicin and cyclophosphamide and proceeded to mastectomy which unfortunately showed T3 N2 residual disease, with all 10 axillary lymph nodes removed positive.  She received adjuvant radiation and then was started on anastrozole.  More recently the had a CT scan of the chest with contrast to evaluate her ongoing left-sided chest wall pain.  This showed several new sclerotic lesions in the thoracic vertebrae but no visceral disease.  PET scan 02/18/2020 showed again no visceral uptake but widespread bony metastatic disease through the axial and appendicular skeleton.  Thoracic and lumbar spine MRI confirmed multiple areas of metastatic disease to bone without evidence of neural impingement.  On 03/28/2020 the patient underwent CT-guided biopsy of the right iliac bone, and this  showed (HBS [phone]) metastatic carcinoma consistent with lobular breast cancer, but now triple negative, with an MIB-1 of 20%.  Her subsequent history is as detailed below   PAST  MEDICAL HISTORY: Past Medical History:  Diagnosis Date  . Breast cancer ([place]) 01/2017   metastatic lobular carcinoma    PAST SURGICAL HISTORY: Past Surgical History:  Procedure Laterality Date  . BILATERAL TOTAL MASTECTOMY WITH AXILLARY LYMPH NODE DISSECTION  09/04/2017  . LEEP      FAMILY HISTORY: Family History  Problem Relation Age of Onset  . Emphysema Mother   . Diabetes Father   . Breast cancer Maternal Aunt   . Breast cancer Paternal Aunt   . Pancreatic cancer Maternal Grandmother   The patient's father died at 59 from complications of diabetes.  He had a sister diagnosed with breast cancer.  The patient's mother died at 67 from complications of emphysema.  Her mother, the patient's maternal grandmother, had pancreatic cancer and a maternal aunt had a history of breast cancer.  The patient herself has a brother, with no history of cancer.  She has no sisters.   GYNECOLOGIC HISTORY:  No LMP recorded. Patient is postmenopausal. Menarche: 59 years old GX P 0 LMP 50 HRT no  Hysterectomy?  No BSO?  No   SOCIAL HISTORY: (updated May 2021)  [name] lives with her husband Wille Glaser and 4 cats.  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    ADVANCED DIRECTIVES: In the absence of any document to the contrary the patient's husband is her healthcare power of attorney.   HEALTH MAINTENANCE: Social History   Tobacco Use  . Smoking status: Current Every Day Smoker    Packs/day: 0.50    Types: Cigarettes  . Smokeless tobacco: Never Used  Substance Use Topics  . Alcohol use: Yes  . Drug use: Not on file     Colonoscopy: 2020  PAP: Up-to-date  Bone density: 2019   Allergies  Allergen Reactions  . Pantoprazole Nausea Only, Other (See Comments) and Shortness Of Breath    Leg cramps, headaches Leg cramps, headaches Leg cramps, headaches Leg cramps, headaches   . Adhesive  [Tape] Dermatitis    Band aids  . Avelox [Moxifloxacin Hcl In Nacl]     Severe headache  .  Ciprofloxacin Rash    Current Outpatient Medications  Medication Sig Dispense Refill  . cholecalciferol (VITAMIN D3) 25 MCG (1000 UNIT) tablet Take 1 tablet (1,000 Units total) by mouth daily.    . ciprofloxacin (CIPRO) 500 MG tablet Take 1 tablet (500 mg total) by mouth 2 (two) times daily. 14 tablet 0  . hydrOXYzine (ATARAX/VISTARIL) 25 MG tablet Take 1 tablet (25 mg total) by mouth at bedtime and may repeat dose one time if needed. 60 tablet 0  . mirtazapine (REMERON) 7.5 MG tablet Take 1 tablet (7.5 mg total) by mouth at bedtime. 30 tablet 1  . ondansetron (ZOFRAN-ODT) 8 MG disintegrating tablet Take by mouth.    . palbociclib (IBRANCE) 125 MG tablet Take 1 tablet (125 mg total) by mouth daily. Take for 21 days on, 7 days off, repeat every 28 days. 21 tablet 0  . prochlorperazine (COMPAZINE) 10 MG tablet Take by mouth.    . promethazine (PHENERGAN) 25 MG suppository Place 1 suppository (25 mg total) rectally every 6 (six) hours as needed for nausea or vomiting. 12 each 3  . valACYclovir (VALTREX) 500 MG tablet 1 tablet bid x 3 days    . Zoledronic Acid (ZOMETA) 4  MG/100ML IVPB Inject into the vein.     No current facility-administered medications for this visit.   Facility-Administered Medications Ordered in Other Visits  Medication Dose Route Frequency Provider Last Rate Last Admin  . 0.9 %  sodium chloride infusion   Intravenous Continuous Ahava Kissoon, Virgie Dad, MD 250 mL/hr at 05/20/20 1049 New Bag at 05/20/20 1049  . filgrastim-sndz (ZARXIO) injection 300 mcg  300 mcg Subcutaneous Once Nethra Mehlberg, Virgie Dad, MD      . zolendronic acid (ZOMETA) 3.5 mg in sodium chloride 0.9 % 100 mL IVPB  3.5 mg Intravenous Once Dorisann Schwanke, Virgie Dad, MD        OBJECTIVE: White woman examined in a recliner  There were no vitals filed for this visit.   There is no height or weight on file to calculate BMI.   Wt Readings from Last 3 Encounters:  05/15/20 120 lb 14.4 oz (54.8 kg)  05/01/20 122 lb 3.2 oz  (55.4 kg)  04/17/20 128 lb 1.6 oz (58.1 kg)      ECOG FS:2 - Symptomatic, <50% confined to bed  Sclerae unicteric, EOMs intact Wearing a mask No cervical or supraclavicular adenopathy Lungs no rales or rhonchi Heart regular rate and rhythm Abd soft, positive bowel sounds, no masses palpated Neuro: nonfocal, well oriented, positive affect Breasts: Deferred   LAB RESULTS:  CMP     Component Value Date/Time   NA 149 (H) 05/20/2020 0950   K 3.9 05/20/2020 0950   CL 114 (H) 05/20/2020 0950   CO2 24 05/20/2020 0950   GLUCOSE 92 05/20/2020 0950   BUN 7 05/20/2020 0950   CREATININE 0.97 05/20/2020 0950   CREATININE 0.90 05/01/2020 1523   CALCIUM 8.2 (L) 05/20/2020 0950   PROT 5.3 (L) 05/20/2020 0950   ALBUMIN 2.3 (L) 05/20/2020 0950   AST 189 (HH) 05/20/2020 0950   AST 561 (HH) 05/01/2020 1523   ALT 41 05/20/2020 0950   ALT 326 (HH) 05/01/2020 1523   ALKPHOS 209 (H) 05/20/2020 0950   BILITOT 0.4 05/20/2020 0950   BILITOT 0.8 05/01/2020 1523   GFRNONAA >60 05/20/2020 0950   GFRNONAA >60 05/01/2020 1523   GFRAA >60 05/20/2020 0950   GFRAA >60 05/01/2020 1523    No results found for: TOTALPROTELP, ALBUMINELP, A1GS, A2GS, BETS, BETA2SER, GAMS, MSPIKE, SPEI  Lab Results  Component Value Date   WBC 6.3 05/20/2020   NEUTROABS 0.4 (LL) 05/20/2020   HGB 8.7 (L) 05/20/2020   HCT 27.5 (L) 05/20/2020   MCV 89.0 05/20/2020   PLT <5 (LL) 05/20/2020    No results found for: LABCA2  No components found for: [id number]  No results for input(s): INR in the last 168 hours.  No results found for: LABCA2  No results found for: YIF027  No results found for: XAJ287  Lab Results  Component Value Date   OMV672 9,870.0 (H) 05/01/2020    No results found for: CA2729  No components found for: HGQUANT  No results found for: CEA1 / No results found for: CEA1   No results found for: AFPTUMOR  No results found for: CHROMOGRNA  No results found for: KPAFRELGTCHN, LAMBDASER,  KAPLAMBRATIO (kappa/lambda light chains)  No results found for: HGBA, HGBA2QUANT, HGBFQUANT, HGBSQUAN (Hemoglobinopathy evaluation)   No results found for: LDH  No results found for: IRON, TIBC, IRONPCTSAT (Iron and TIBC)  No results found for: FERRITIN  Urinalysis    Component Value Date/Time   BILIRUBINUR negative 03/04/2016 1453   KETONESUR trace (5) (A) 03/04/2016  1453   PROTEINUR negative 03/04/2016 1453   UROBILINOGEN 0.2 03/04/2016 1453   NITRITE Negative 03/04/2016 1453   LEUKOCYTESUR Negative 03/04/2016 1453     STUDIES: MR LIVER W WO CONTRAST  Result Date: 05/19/2020 CLINICAL DATA:  Lobular breast cancer. Elevated liver function tests. Evaluate for liver metastasis. EXAM: MRI ABDOMEN WITHOUT AND WITH CONTRAST TECHNIQUE: Multiplanar multisequence MR imaging of the abdomen was performed both before and after the administration of intravenous contrast. CONTRAST:  49m GADAVIST GADOBUTROL 1 MMOL/ML IV SOLN COMPARISON:  [facility] PET 02/18/2020. [facility] 03/17/2017 abdominopelvic CT. FINDINGS: Portions of exam are mildly motion degraded. Lower chest: Moderate left and small right pleural effusions. Normal heart size. Bilateral breast implants. Hepatobiliary: High left hepatic lobe 8 mm cyst, present back 03/17/2017. No suspicious liver lesion. No significant stenosis. Normal gallbladder, without biliary ductal dilatation. Pancreas:  Normal, without mass or ductal dilatation. Spleen:  Borderline splenomegaly, 12.9 cm craniocaudal. Adrenals/Urinary Tract: Normal adrenal glands. Normal kidneys, without hydronephrosis. Stomach/Bowel: Normal stomach and abdominal bowel loops. Vascular/Lymphatic: Aortic atherosclerosis. No retroperitoneal or retrocrural adenopathy. Other:  No ascites. Musculoskeletal: Relatively diffuse osseous metastasis, as evidenced by heterogeneous enhancement throughout. IMPRESSION: 1. Mildly motion degraded exam. 2. Given this limitation, no  evidence of hepatic metastasis or explanation for elevated liver function tests. 3. Widespread osseous metastasis. 4. Left larger than right pleural effusions. 5.  Aortic Atherosclerosis (ICD10-I70.0). Electronically Signed   By: KAbigail MiyamotoM.D.   On: 05/19/2020 14:09   NM PET ([facility]) WHOLE BODY  Result Date: 04/28/2020 CLINICAL DATA:  Metastatic breast carcinoma. Bone metastasis on FDG PET scan. Lobular carcinoma status post mastectomies chemotherapy and adjuvant therapy. T3 disease with nodal metastasis. Initial ER positive pathology. Subsequent biopsy suggest triple negative phenotype. EXAM: NUCLEAR MEDICINE PET WHOLE BODY TECHNIQUE: 5.7 mCi F-18 Estradiol was injected intravenously. Full-ring PET imaging was performed from the vertex to feet after the radiotracer. CT data was obtained and used for attenuation correction and anatomic localization. COMPARISON:  FDG PET scan 02/18/2020, CT 02/21/2020 FINDINGS: Mediastinal blood pool activity: 3.7 HEAD/NECK: Focus of intense uptake at the LEFT posterior vertex is favored within the skull. However, more inferiorly in the LEFT cerebral parenchyma medial to the ventricular atria there is a focus of moderate radiotracer activity with SUV max equal 5.1. (Image 15 of fused data set) Incidental CT findings: None CHEST: There is intense activity associated with small mediastinal lymph nodes including subcarinal lymph nodes, prevascular lymph nodes and RIGHT hilar lymph node. These lymph nodes are small and difficult define on CT. Subcarinal activity is intense with SUV max equal 14.3. RIGHT hilar activity also intense with SUV max equal 14.8. There is mild activity associated with pleuroparenchymal thickening in the LEFT upper lobe at radiation treatment site. Incidental CT findings: Small LEFT effusion. ABDOMEN/PELVIS: No clear abnormal activity liver however intense background activity within liver limits sensitivity for liver metastasis. No hypermetabolic  abdominopelvic lymph nodes. No evidence of visceral metastasis otherwise. Incidental CT findings: Post hysterectomy.  No adnexal abnormality. SKELETON: The dominant finding is multifocal intense radiotracer activity within the skeleton. Example lesion in the RIGHT iliac bone adjacent to SI joint with SUV max equal 16.4. Example lesion in the L2 vertebral body with SUV max equal 18.9. While nearly every vertebral body has sclerotic metastasis approximately half of the vertebral bodies have intense radiotracer activity. For example lesion at L2 with SUV max equal 18.9. Lesion at T10 with SUV max equal 22.2. Lesions involve the appendicular skeleton  including the RIGHT femoral head with SUV max equal 14.8. Intense lesion in the RIGHT scapula adjacent the glenoid fossa with SUV max equal 20.8. Multiple foci at the skull base and cervical spine. Incidental CT findings: No pathologic fracture. Diffuse sclerotic metastasis EXTREMITIES: Proximal appendicular skeleton metastasis as above Incidental CT findings: None IMPRESSION: 1. Dominant finding is intense multifocal estrogen positive breast cancer within the axillary and appendicular skeleton. While there is diffuse sclerotic metastasis throughout the spine, approximately half of the vertebral bodies have intense radiotracer activity. 2. Intense radiotracer activity associated with small mediastinal and RIGHT hilar lymph nodes is concerning for estrogen positive breast cancer in the mediastinum. These lesions are very small and difficult to define by CT imaging. 3. Indeterminate lesion within the LEFT cerebral hemisphere. Recommend MRI brain with and without contrast for further evaluation. Calvarial metastasis noted which could influence the above findings. 4. No visceral metastasis identified (other than the indeterminate lesion in the brain). Of note, sensitivity is reduced in liver due to intense background estrogen receptor positivity. Electronically Signed   By:  Suzy Bouchard M.D.   On: 04/28/2020 17:25   This is additional information from Dr. Ilda Foil regarding the recent estradiol F-18 scan: Shared this Estradiol PET scan with the company medical imaging expert.  He agreed with our major findings of E2 positive disease in the bones and mediastinum. He felt the "brain" uptake was probably a skull lesion with motion artifact. I did mention in my report that the brain activity may be related to adjacent positive skull lesion.   Interestingly, the activity associated with the left upper lobe radiation change is a know but poorly understood association of radiotracer uptake and radiation fibrosis (not inflammation). I had down played this finding in the report as I suspected an explanation along these lines.   Call with questions.   Nicole Kindred     ELIGIBLE FOR AVAILABLE RESEARCH PROTOCOL: To be determined  ASSESSMENT: 59 y.o. [place] woman with metastatic lobular breast cancer as follows:  (1) status post left breast overlapping and axillary lymph node biopsy 01/26/2017 for a clinical mT2 N1, stage IIA invasive lobular carcinoma, strongly estrogen and progesterone receptor positive, HER-2 not amplified  (2) treated neoadjuvantly with docetaxel, doxorubicin and cyclophosphamide for 6 cycles, between 03/10/2017 and 07/01/2017  (3) status post bilateral mastectomies 08/25/2017 showing residual invasive lobular carcinoma, ypT3 ypN3, stage IIIB  (4) status post adjuvant radiation to the left breast and axilla 11/30/2017 through 01/10/2018.  (5) anastrozole started 09/22/2017  METASTATIC DISEASE (6) CT scan of the chest 02/11/2020, PET scan 02/18/2020 and MRI of the thoracolumbar spine 03/12/2020 show multiple bone lesions (axial and appendicular) without neural impingement and without evidence of visceral disease  (a) CT-guided right iliac crest biopsy 03/28/2020 confirms metastatic invasive lobular breast cancer, now triple negative, with an  MIB-1 of 20%  (b) F-18 estradiol "cerinanna" PET scan 04/28/2020 shows stron estrogen avidity in bone lesions, questionable brain lesion, does not resolve issue of possible liver lesions  (c) liver MRI 05/06/2020  (b) brain MRI  (7) palbociclib and fulvestrant started 05/01/2020  (8) zoledronate to start 05/15/2020, to be repeated every 12 weeks   PLAN: I discussed results of the F-18 estradiol scan with Sonnie and Joe.  They understand it shows avid estrogen uptake and this is convincing for continuing estrogen sensitivity of the tumor despite the earlier bone marrow results suggesting the tumor had become triple negative.  It is possible the decalcification procedure is responsible for that  discrepancy.  In any case she has significant estrogen receptor positive disease and we are addressing that.  She has a good understanding of the possible toxicities side effects and complications of palbociclib and fulvestrant and will be starting today.  She is significantly thrombocytopenic.  We are proceeding with a unit of platelets today.  We will repeat her lab work when she returns in 2 weeks for her second dose of fulvestrant and she may need for further transfusions at that time.  Wille Glaser is very frustrated that Carrin is not better and in his eyes does not seem to be helping herself.  I think she is doing the best she can but it is very debilitating to have uncontrolled metastatic cancer.  I think within 2 to 3 months if we can get through the initial treatments she will start feeling better, go back to walking and perhaps even running and doing yoga as she did before.  I did encourage her to take her Phenergan 2-3 times daily.  I will see if we can have our financial advisor meet with Joe when Zyia returns in 2 weeks for her next fulvestrant dose.  Total encounter time 60 minutes.Sarajane Jews C. Odarius Dines, MD 05/20/2020 12:14 PM Medical Oncology and Hematology [facility] [place], [place] [address] Tel. [phone]    Fax. [phone]   This document serves as a record of services personally performed by Lurline Del, MD. It was created on his behalf by Wilburn Mylar, a trained medical scribe. The creation of this record is based on the scribe's personal observations and the provider's statements to them.   I, Lurline Del MD, have reviewed the above documentation for accuracy and completeness, and I agree with the above.   *Total Encounter Time as defined by the Centers for Medicare and Medicaid Services includes, in addition to the face-to-face time of a patient visit (documented in the note above) non-face-to-face time: obtaining and reviewing outside history, ordering and reviewing medications, tests or procedures, care coordination (communications with other health care professionals or caregivers) and documentation in the medical record.

## 2020-05-20 NOTE — Patient Instructions (Addendum)
Zoledronic Acid injection (Hypercalcemia, Oncology) What is this medicine? ZOLEDRONIC ACID (ZOE le dron ik AS id) lowers the amount of calcium loss from bone. It is used to treat too much calcium in your blood from cancer. It is also used to prevent complications of cancer that has spread to the bone. This medicine may be used for other purposes; ask your health care provider or pharmacist if you have questions. COMMON BRAND NAME(S): Zometa What should I tell my health care provider before I take this medicine? They need to know if you have any of these conditions:  aspirin-sensitive asthma  cancer, especially if you are receiving medicines used to treat cancer  dental disease or wear dentures  infection  kidney disease  receiving corticosteroids like dexamethasone or prednisone  an unusual or allergic reaction to zoledronic acid, other medicines, foods, dyes, or preservatives  pregnant or trying to get pregnant  breast-feeding How should I use this medicine? This medicine is for infusion into a vein. It is given by a health care professional in a hospital or clinic setting. Talk to your pediatrician regarding the use of this medicine in children. Special care may be needed. Overdosage: If you think you have taken too much of this medicine contact a poison control center or emergency room at once. NOTE: This medicine is only for you. Do not share this medicine with others. What if I miss a dose? It is important not to miss your dose. Call your doctor or health care professional if you are unable to keep an appointment. What may interact with this medicine?  certain antibiotics given by injection  NSAIDs, medicines for pain and inflammation, like ibuprofen or naproxen  some diuretics like bumetanide, furosemide  teriparatide  thalidomide This list may not describe all possible interactions. Give your health care provider a list of all the medicines, herbs, non-prescription  drugs, or dietary supplements you use. Also tell them if you smoke, drink alcohol, or use illegal drugs. Some items may interact with your medicine. What should I watch for while using this medicine? Visit your doctor or health care professional for regular checkups. It may be some time before you see the benefit from this medicine. Do not stop taking your medicine unless your doctor tells you to. Your doctor may order blood tests or other tests to see how you are doing. Women should inform their doctor if they wish to become pregnant or think they might be pregnant. There is a potential for serious side effects to an unborn child. Talk to your health care professional or pharmacist for more information. You should make sure that you get enough calcium and vitamin D while you are taking this medicine. Discuss the foods you eat and the vitamins you take with your health care professional. Some people who take this medicine have severe bone, joint, and/or muscle pain. This medicine may also increase your risk for jaw problems or a broken thigh bone. Tell your doctor right away if you have severe pain in your jaw, bones, joints, or muscles. Tell your doctor if you have any pain that does not go away or that gets worse. Tell your dentist and dental surgeon that you are taking this medicine. You should not have major dental surgery while on this medicine. See your dentist to have a dental exam and fix any dental problems before starting this medicine. Take good care of your teeth while on this medicine. Make sure you see your dentist for regular follow-up   appointments. What side effects may I notice from receiving this medicine? Side effects that you should report to your doctor or health care professional as soon as possible:  allergic reactions like skin rash, itching or hives, swelling of the face, lips, or tongue  anxiety, confusion, or depression  breathing problems  changes in vision  eye  pain  feeling faint or lightheaded, falls  jaw pain, especially after dental work  mouth sores  muscle cramps, stiffness, or weakness  redness, blistering, peeling or loosening of the skin, including inside the mouth  trouble passing urine or change in the amount of urine Side effects that usually do not require medical attention (report to your doctor or health care professional if they continue or are bothersome):  bone, joint, or muscle pain  constipation  diarrhea  fever  hair loss  irritation at site where injected  loss of appetite  nausea, vomiting  stomach upset  trouble sleeping  trouble swallowing  weak or tired This list may not describe all possible side effects. Call your doctor for medical advice about side effects. You may report side effects to FDA at 1-800-FDA-1088. Where should I keep my medicine? This drug is given in a hospital or clinic and will not be stored at home. NOTE: This sheet is a summary. It may not cover all possible information. If you have questions about this medicine, talk to your doctor, pharmacist, or health care provider.  2020 Elsevier/Gold Standard (2014-04-20 14:19:39)   Filgrastim, G-CSF injection What is this medicine? FILGRASTIM, G-CSF (fil GRA stim) is a granulocyte colony-stimulating factor that stimulates the growth of neutrophils, a type of white blood cell (WBC) important in the body's fight against infection. It is used to reduce the incidence of fever and infection in patients with certain types of cancer who are receiving chemotherapy that affects the bone marrow, to stimulate blood cell production for removal of WBCs from the body prior to a bone marrow transplantation, to reduce the incidence of fever and infection in patients who have severe chronic neutropenia, and to improve survival outcomes following high-dose radiation exposure that is toxic to the bone marrow. This medicine may be used for other purposes;  ask your health care provider or pharmacist if you have questions. COMMON BRAND NAME(S): Neupogen, Nivestym, Zarxio What should I tell my health care provider before I take this medicine? They need to know if you have any of these conditions:  kidney disease  latex allergy  ongoing radiation therapy  sickle cell disease  an unusual or allergic reaction to filgrastim, pegfilgrastim, other medicines, foods, dyes, or preservatives  pregnant or trying to get pregnant  breast-feeding How should I use this medicine? This medicine is for injection under the skin or infusion into a vein. As an infusion into a vein, it is usually given by a health care professional in a hospital or clinic setting. If you get this medicine at home, you will be taught how to prepare and give this medicine. Refer to the Instructions for Use that come with your medication packaging. Use exactly as directed. Take your medicine at regular intervals. Do not take your medicine more often than directed. It is important that you put your used needles and syringes in a special sharps container. Do not put them in a trash can. If you do not have a sharps container, call your pharmacist or healthcare provider to get one. Talk to your pediatrician regarding the use of this medicine in children. While  this drug may be prescribed for children as young as 7 months for selected conditions, precautions do apply. Overdosage: If you think you have taken too much of this medicine contact a poison control center or emergency room at once. NOTE: This medicine is only for you. Do not share this medicine with others. What if I miss a dose? It is important not to miss your dose. Call your doctor or health care professional if you miss a dose. What may interact with this medicine? This medicine may interact with the following medications:  medicines that may cause a release of neutrophils, such as lithium This list may not describe all  possible interactions. Give your health care provider a list of all the medicines, herbs, non-prescription drugs, or dietary supplements you use. Also tell them if you smoke, drink alcohol, or use illegal drugs. Some items may interact with your medicine. What should I watch for while using this medicine? You may need blood work done while you are taking this medicine. What side effects may I notice from receiving this medicine? Side effects that you should report to your doctor or health care professional as soon as possible:  allergic reactions like skin rash, itching or hives, swelling of the face, lips, or tongue  back pain  dizziness or feeling faint  fever  pain, redness, or irritation at site where injected  pinpoint red spots on the skin  shortness of breath or breathing problems  signs and symptoms of kidney injury like trouble passing urine, change in the amount of urine, or red or dark-brown urine  stomach or side pain, or pain at the shoulder  swelling  tiredness  unusual bleeding or bruising Side effects that usually do not require medical attention (report to your doctor or health care professional if they continue or are bothersome):  bone pain  cough  diarrhea  hair loss  headache  muscle pain This list may not describe all possible side effects. Call your doctor for medical advice about side effects. You may report side effects to FDA at 1-800-FDA-1088. Where should I keep my medicine? Keep out of the reach of children. Store in a refrigerator between 2 and 8 degrees C (36 and 46 degrees F). Do not freeze. Keep in carton to protect from light. Throw away this medicine if vials or syringes are left out of the refrigerator for more than 24 hours. Throw away any unused medicine after the expiration date. NOTE: This sheet is a summary. It may not cover all possible information. If you have questions about this medicine, talk to your doctor, pharmacist, or  health care provider.  2020 Elsevier/Gold Standard (2018-09-21 16:55:01)     Platelet Transfusion A platelet transfusion is a procedure in which you receive donated platelets through an IV. Platelets are tiny pieces of blood cells. When you get an injury, platelets clump together in the area to form a blood clot. This helps stop bleeding and is the beginning of the healing process. If you have too few platelets, your blood may have trouble clotting. This may cause you to bleed and bruise very easily. You may need a platelet transfusion if you have a condition that causes a low number of platelets (thrombocytopenia). A platelet transfusion may be used to stop or prevent excessive bleeding. Tell a health care provider about:  Any reactions you have had during previous transfusions.  Any allergies you have.  All medicines you are taking, including vitamins, herbs, eye drops, creams, and  over-the-counter medicines.  Any blood disorders you have.  Any surgeries you have had.  Any medical conditions you have.  Whether you are pregnant or may be pregnant. What are the risks? Generally, this is a safe procedure. However, problems may occur, including:  Fever.  Infection.  Allergic reaction to the donor platelets.  Your body's disease-fighting system (immune system) attacking the donor platelets (hemolytic reaction). This is rare.  A rare reaction that causes lung damage (transfusion-related acute lung injury). What happens before the procedure? Medicines  Ask your health care provider about: ? Changing or stopping your regular medicines. This is especially important if you are taking diabetes medicines or blood thinners. ? Taking medicines such as aspirin and ibuprofen. These medicines can thin your blood. Do not take these medicines unless your health care provider tells you to take them. ? Taking over-the-counter medicines, vitamins, herbs, and supplements. General  instructions  You will have a blood test to determine your blood type. Your blood type determines what kind of platelets you will be given.  Follow instructions from your health care provider about eating or drinking restrictions.  If you have had an allergic reaction to a transfusion in the past, you may be given medicine to help prevent a reaction.  Your temperature, blood pressure, pulse, and breathing will be monitored. What happens during the procedure?   An IV will be inserted into one of your veins.  For your safety, two health care providers will verify your identity along with the donor platelets about to be infused.  A bag of donor platelets will be connected to your IV. The platelets will flow into your bloodstream. This usually takes 30-60 minutes.  Your temperature, blood pressure, pulse, and breathing will be monitored during the transfusion. This helps detect early signs of any reaction.  You will also be monitored for other symptoms that may indicate a reaction, including chills, hives, or itching.  If you have signs of a reaction at any time, your transfusion will be stopped, and you may be given medicine to help manage the reaction.  When your transfusion is complete, your IV will be removed.  Pressure may be applied to the IV site for a few minutes to stop any bleeding.  The IV site will be covered with a bandage (dressing). The procedure may vary among health care providers and hospitals. What happens after the procedure?  Your blood pressure, temperature, pulse, and breathing will be monitored until you leave the hospital or clinic.  You may have some bruising and soreness at your IV site. Follow these instructions at home: Medicines  Take over-the-counter and prescription medicines only as told by your health care provider.  Talk with your health care provider before you take any medicines that contain aspirin or NSAIDs. These medicines increase your risk  for dangerous bleeding. General instructions  Change or remove your dressing as told by your health care provider.  Return to your normal activities as told by your health care provider. Ask your health care provider what activities are safe for you.  Do not take baths, swim, or use a hot tub until your health care provider approves. Ask your health care provider if you may take showers.  Check your IV site every day for signs of infection. Check for: ? Redness, swelling, or pain. ? Fluid or blood. If fluid or blood drains from your IV site, use your hands to press down firmly on a bandage covering the area for a  minute or two. Doing this should stop the bleeding. ? Warmth. ? Pus or a bad smell.  Keep all follow-up visits as told by your health care provider. This is important. Contact a health care provider if you have:  A headache that does not go away with medicine.  Hives, rash, or itchy skin.  Nausea or vomiting.  Unusual tiredness or weakness.  Signs of infection at your IV site. Get help right away if:  You have a fever or chills.  You urinate less often than usual.  Your urine is darker colored than normal.  You have any of the following: ? Trouble breathing. ? Pain in your back, abdomen, or chest. ? Cool, clammy skin. ? A fast heartbeat. Summary  Platelets are tiny pieces of blood cells that clump together to form a blood clot when you have an injury. If you have too few platelets, your blood may have trouble clotting.  A platelet transfusion is a procedure in which you receive donated platelets through an IV.  A platelet transfusion may be used to stop or prevent excessive bleeding.  After the procedure, check your IV site every day for signs of infection, including redness, swelling, pain, or warmth. This information is not intended to replace advice given to you by your health care provider. Make sure you discuss any questions you have with your health  care provider. Document Revised: 12/28/2017 Document Reviewed: 12/28/2017 Elsevier Patient Education  2020 [employer].

## 2020-05-21 ENCOUNTER — Telehealth: Payer: Self-pay | Admitting: Oncology

## 2020-05-21 LAB — PREPARE PLATELET PHERESIS: Unit division: 0

## 2020-05-21 LAB — BPAM PLATELET PHERESIS
Blood Product Expiration Date: 202106162359
ISSUE DATE / TIME: 202106151123
Unit Type and Rh: 5100

## 2020-05-21 LAB — CANCER ANTIGEN 15-3: CA 15-3: 14683 U/mL — ABNORMAL HIGH (ref 0.0–25.0)

## 2020-05-21 NOTE — Telephone Encounter (Signed)
Scheduled appts per 6/15 los. Pt confirmed appt dates and times.

## 2020-05-21 NOTE — Progress Notes (Signed)
[place]  Telephone:([phone]) [phone] [phone]) [phone]     ID: Jamie Hartman DOB: [birth year]  MR#: [id number]  HLK#:[id number]  Patient Care Team: Edmonia James, PA-C as PCP - General (Physician Assistant) Beverely Pace, MD as Referring Physician (Surgical Oncology) Amere Bricco, Virgie Dad, MD as Consulting Physician (Oncology) Chauncey Cruel, MD OTHER MD:  CHIEF COMPLAINT: Invasive lobular breast cancer, stage IV, estrogen receptor positive; pancytopenia  CURRENT TREATMENT: Fulvestrant [and palbociclib]; zoledronate   INTERVAL HISTORY: Jamie Hartman returns today for follow up of her triple negative breast cancer accompanied by her husband Jamie Hartman.   She is having significant nausea and headaches and a brain MRI has been scheduled for May 28, 2020 to clarify that issue.  She started fulvestrant May 01, 2020.  She received a second dose May 15, 2020 and will receive her third dose June 24.  She was supposed to have received zoledronate 05/15/2020 but there was a bump in her creatinine so was postponed until 615.  She received 3.5 mg.  At the last visit she had a very low platelet count.  We went ahead and transfused her and obtain a 1 hour posttransfusion value which was 80,000.  She is down to 10 again today but her hemoglobin is stable.  Her palbociclib has been on hold because of cytopenias.   REVIEW OF SYSTEMS: Jamie Hartman has started to feel a little bit better.  She actually ate some chicken salad last night and that went well.  She still has some nausea although the headaches are better.  She falls asleep with Compazine and Phenergan.  She does not like the ondansetron because it constipates her.  She is worried about bloating of her stomach which feels and looks bad to her.  She did fine with the Zometa last week.  Of course she has not previously.  She has had some minor epistaxis but no other overt bleeding.  She is having some skin lesions which are probably  precancerous squamous keratoses responding to the [place].  Detailed review of systems today was otherwise stable   HISTORY OF CURRENT ILLNESS: From the original intake note:  Jamie Hartman 's breast cancer history dates back to February 2018 when she noted left nipple retraction.  Her subsequent evaluation and treatment is detailed below, but basically she received neoadjuvant chemotherapy with docetaxel doxorubicin and cyclophosphamide and proceeded to mastectomy which unfortunately showed T3 N2 residual disease, with all 10 axillary lymph nodes removed positive.  She received adjuvant radiation and then was started on anastrozole.  More recently the had a CT scan of the chest with contrast to evaluate her ongoing left-sided chest wall pain.  This showed several new sclerotic lesions in the thoracic vertebrae but no visceral disease.  PET scan 02/18/2020 showed again no visceral uptake but widespread bony metastatic disease through the axial and appendicular skeleton.  Thoracic and lumbar spine MRI confirmed multiple areas of metastatic disease to bone without evidence of neural impingement.  On 03/28/2020 the patient underwent CT-guided biopsy of the right iliac bone, and this showed (HBS [phone]) metastatic carcinoma consistent with lobular breast cancer, but now triple negative, with an MIB-1 of 20%.  Her subsequent history is as detailed below   PAST MEDICAL HISTORY: Past Medical History:  Diagnosis Date  . Breast cancer ([place]) 01/2017   metastatic lobular carcinoma    PAST SURGICAL HISTORY: Past Surgical History:  Procedure Laterality Date  . BILATERAL TOTAL MASTECTOMY WITH AXILLARY LYMPH NODE DISSECTION  09/04/2017  .  LEEP      FAMILY HISTORY: Family History  Problem Relation Age of Onset  . Emphysema Mother   . Diabetes Father   . Breast cancer Maternal Aunt   . Breast cancer Paternal Aunt   . Pancreatic cancer Maternal Grandmother   The patient's father died at 82 from  complications of diabetes.  He had a sister diagnosed with breast cancer.  The patient's mother died at 63 from complications of emphysema.  Her mother, the patient's maternal grandmother, had pancreatic cancer and a maternal aunt had a history of breast cancer.  The patient herself has a brother, with no history of cancer.  She has no sisters.   GYNECOLOGIC HISTORY:  No LMP recorded. Patient is postmenopausal. Menarche: 59 years old GX P 0 LMP 50 HRT no  Hysterectomy?  No BSO?  No   SOCIAL HISTORY: (updated May 2021)  Jamie Hartman lives with her husband Jamie Hartman and 4 cats.  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    ADVANCED DIRECTIVES: In the absence of any document to the contrary the patient's husband is her healthcare power of attorney.   HEALTH MAINTENANCE: Social History   Tobacco Use  . Smoking status: Current Every Day Smoker    Packs/day: 0.50    Types: Cigarettes  . Smokeless tobacco: Never Used  Substance Use Topics  . Alcohol use: Yes  . Drug use: Not on file     Colonoscopy: 2020  PAP: Up-to-date  Bone density: 2019   Allergies  Allergen Reactions  . Pantoprazole Nausea Only, Other (See Comments) and Shortness Of Breath    Leg cramps, headaches Leg cramps, headaches Leg cramps, headaches Leg cramps, headaches   . Adhesive  [Tape] Dermatitis    Band aids  . Avelox [Moxifloxacin Hcl In Nacl]     Severe headache  . Ciprofloxacin Rash    Current Outpatient Medications  Medication Sig Dispense Refill  . cholecalciferol (VITAMIN D3) 25 MCG (1000 UNIT) tablet Take 1 tablet (1,000 Units total) by mouth daily.    . ciprofloxacin (CIPRO) 500 MG tablet Take 1 tablet (500 mg total) by mouth 2 (two) times daily. 14 tablet 0  . hydrOXYzine (ATARAX/VISTARIL) 25 MG tablet Take 1 tablet (25 mg total) by mouth at bedtime and may repeat dose one time if needed. 60 tablet 0  . mirtazapine (REMERON) 7.5 MG tablet Take 1 tablet (7.5 mg total) by mouth at bedtime. 30 tablet 1  .  ondansetron (ZOFRAN-ODT) 8 MG disintegrating tablet Take by mouth.    . palbociclib (IBRANCE) 125 MG tablet Take 1 tablet (125 mg total) by mouth daily. Take for 21 days on, 7 days off, repeat every 28 days. 21 tablet 0  . prochlorperazine (COMPAZINE) 10 MG tablet Take by mouth.    . promethazine (PHENERGAN) 25 MG suppository Place 1 suppository (25 mg total) rectally every 6 (six) hours as needed for nausea or vomiting. 12 each 3  . valACYclovir (VALTREX) 500 MG tablet 1 tablet bid x 3 days    . Zoledronic Acid (ZOMETA) 4 MG/100ML IVPB Inject into the vein.     No current facility-administered medications for this visit.    OBJECTIVE: White woman examined in a wheelchair  Vitals:   05/22/20 1330  BP: 114/67  Pulse: (!) 102  Resp: 18  Temp: 98.3 F (36.8 C)  SpO2: 100%     Body mass index is 22.92 kg/m.   Wt Readings from Last 3 Encounters:  05/22/20 125 lb 4.8 oz (  56.8 kg)  05/15/20 120 lb 14.4 oz (54.8 kg)  05/01/20 122 lb 3.2 oz (55.4 kg)  For vitals on the May 20, 2020 visit please see the symptom clinic flowsheet    ECOG FS:2 - Symptomatic, <50% confined to bed  Sclerae unicteric, EOMs intact Wearing a mask No cervical or supraclavicular adenopathy Lungs no rales or rhonchi Heart regular rate and rhythm Abd soft, nontender, mildly prominent, positive bowel sounds MSK no focal spinal tenderness, no upper extremity lymphedema Neuro: nonfocal, well oriented, positive affect Breasts: Deferred   LAB RESULTS:  CMP     Component Value Date/Time   NA 149 (H) 05/20/2020 0950   K 3.9 05/20/2020 0950   CL 114 (H) 05/20/2020 0950   CO2 24 05/20/2020 0950   GLUCOSE 92 05/20/2020 0950   BUN 7 05/20/2020 0950   CREATININE 0.97 05/20/2020 0950   CREATININE 0.90 05/01/2020 1523   CALCIUM 8.2 (L) 05/20/2020 0950   PROT 5.3 (L) 05/20/2020 0950   ALBUMIN 2.3 (L) 05/20/2020 0950   AST 189 (HH) 05/20/2020 0950   AST 561 (HH) 05/01/2020 1523   ALT 41 05/20/2020 0950   ALT  326 (HH) 05/01/2020 1523   ALKPHOS 209 (H) 05/20/2020 0950   BILITOT 0.4 05/20/2020 0950   BILITOT 0.8 05/01/2020 1523   GFRNONAA >60 05/20/2020 0950   GFRNONAA >60 05/01/2020 1523   GFRAA >60 05/20/2020 0950   GFRAA >60 05/01/2020 1523    No results found for: TOTALPROTELP, ALBUMINELP, A1GS, A2GS, BETS, BETA2SER, GAMS, MSPIKE, SPEI  Lab Results  Component Value Date   WBC 7.9 05/22/2020   NEUTROABS PENDING 05/22/2020   HGB 8.2 (L) 05/22/2020   HCT 25.6 (L) 05/22/2020   MCV 88.3 05/22/2020   PLT 10 (L) 05/22/2020    No results found for: LABCA2  No components found for: UXLKGM010  No results for input(s): INR in the last 168 hours.  No results found for: LABCA2  No results found for: UVO536  No results found for: UYQ034  Lab Results  Component Value Date   VQQ595 14,683.0 (H) 05/20/2020    No results found for: CA2729  No components found for: HGQUANT  No results found for: CEA1 / No results found for: CEA1   No results found for: AFPTUMOR  No results found for: CHROMOGRNA  No results found for: KPAFRELGTCHN, LAMBDASER, KAPLAMBRATIO (kappa/lambda light chains)  No results found for: HGBA, HGBA2QUANT, HGBFQUANT, HGBSQUAN (Hemoglobinopathy evaluation)   No results found for: LDH  No results found for: IRON, TIBC, IRONPCTSAT (Iron and TIBC)  No results found for: FERRITIN  Urinalysis    Component Value Date/Time   BILIRUBINUR negative 03/04/2016 1453   KETONESUR trace (5) (A) 03/04/2016 1453   PROTEINUR negative 03/04/2016 1453   UROBILINOGEN 0.2 03/04/2016 1453   NITRITE Negative 03/04/2016 1453   LEUKOCYTESUR Negative 03/04/2016 1453     STUDIES: MR LIVER W WO CONTRAST  Result Date: 05/19/2020 CLINICAL DATA:  Lobular breast cancer. Elevated liver function tests. Evaluate for liver metastasis. EXAM: MRI ABDOMEN WITHOUT AND WITH CONTRAST TECHNIQUE: Multiplanar multisequence MR imaging of the abdomen was performed both before and after the  administration of intravenous contrast. CONTRAST:  7m GADAVIST GADOBUTROL 1 MMOL/ML IV SOLN COMPARISON:  [facility] PET 02/18/2020. [facility] 03/17/2017 abdominopelvic CT. FINDINGS: Portions of exam are mildly motion degraded. Lower chest: Moderate left and small right pleural effusions. Normal heart size. Bilateral breast implants. Hepatobiliary: High left hepatic lobe 8 mm cyst, present back  03/17/2017. No suspicious liver lesion. No significant stenosis. Normal gallbladder, without biliary ductal dilatation. Pancreas:  Normal, without mass or ductal dilatation. Spleen:  Borderline splenomegaly, 12.9 cm craniocaudal. Adrenals/Urinary Tract: Normal adrenal glands. Normal kidneys, without hydronephrosis. Stomach/Bowel: Normal stomach and abdominal bowel loops. Vascular/Lymphatic: Aortic atherosclerosis. No retroperitoneal or retrocrural adenopathy. Other:  No ascites. Musculoskeletal: Relatively diffuse osseous metastasis, as evidenced by heterogeneous enhancement throughout. IMPRESSION: 1. Mildly motion degraded exam. 2. Given this limitation, no evidence of hepatic metastasis or explanation for elevated liver function tests. 3. Widespread osseous metastasis. 4. Left larger than right pleural effusions. 5.  Aortic Atherosclerosis (ICD10-I70.0). Electronically Signed   By: Abigail Miyamoto M.D.   On: 05/19/2020 14:09   NM PET (CERIANNA) WHOLE BODY  Result Date: 04/28/2020 CLINICAL DATA:  Metastatic breast carcinoma. Bone metastasis on FDG PET scan. Lobular carcinoma status post mastectomies chemotherapy and adjuvant therapy. T3 disease with nodal metastasis. Initial ER positive pathology. Subsequent biopsy suggest triple negative phenotype. EXAM: NUCLEAR MEDICINE PET WHOLE BODY TECHNIQUE: 5.7 mCi F-18 Estradiol was injected intravenously. Full-ring PET imaging was performed from the vertex to feet after the radiotracer. CT data was obtained and used for attenuation correction and anatomic  localization. COMPARISON:  FDG PET scan 02/18/2020, CT 02/21/2020 FINDINGS: Mediastinal blood pool activity: 3.7 HEAD/NECK: Focus of intense uptake at the LEFT posterior vertex is favored within the skull. However, more inferiorly in the LEFT cerebral parenchyma medial to the ventricular atria there is a focus of moderate radiotracer activity with SUV max equal 5.1. (Image 15 of fused data set) Incidental CT findings: None CHEST: There is intense activity associated with small mediastinal lymph nodes including subcarinal lymph nodes, prevascular lymph nodes and RIGHT hilar lymph node. These lymph nodes are small and difficult define on CT. Subcarinal activity is intense with SUV max equal 14.3. RIGHT hilar activity also intense with SUV max equal 14.8. There is mild activity associated with pleuroparenchymal thickening in the LEFT upper lobe at radiation treatment site. Incidental CT findings: Small LEFT effusion. ABDOMEN/PELVIS: No clear abnormal activity liver however intense background activity within liver limits sensitivity for liver metastasis. No hypermetabolic abdominopelvic lymph nodes. No evidence of visceral metastasis otherwise. Incidental CT findings: Post hysterectomy.  No adnexal abnormality. SKELETON: The dominant finding is multifocal intense radiotracer activity within the skeleton. Example lesion in the RIGHT iliac bone adjacent to SI joint with SUV max equal 16.4. Example lesion in the L2 vertebral body with SUV max equal 18.9. While nearly every vertebral body has sclerotic metastasis approximately half of the vertebral bodies have intense radiotracer activity. For example lesion at L2 with SUV max equal 18.9. Lesion at T10 with SUV max equal 22.2. Lesions involve the appendicular skeleton including the RIGHT femoral head with SUV max equal 14.8. Intense lesion in the RIGHT scapula adjacent the glenoid fossa with SUV max equal 20.8. Multiple foci at the skull base and cervical spine. Incidental  CT findings: No pathologic fracture. Diffuse sclerotic metastasis EXTREMITIES: Proximal appendicular skeleton metastasis as above Incidental CT findings: None IMPRESSION: 1. Dominant finding is intense multifocal estrogen positive breast cancer within the axillary and appendicular skeleton. While there is diffuse sclerotic metastasis throughout the spine, approximately half of the vertebral bodies have intense radiotracer activity. 2. Intense radiotracer activity associated with small mediastinal and RIGHT hilar lymph nodes is concerning for estrogen positive breast cancer in the mediastinum. These lesions are very small and difficult to define by CT imaging. 3. Indeterminate lesion within the LEFT cerebral hemisphere. Recommend  MRI brain with and without contrast for further evaluation. Calvarial metastasis noted which could influence the above findings. 4. No visceral metastasis identified (other than the indeterminate lesion in the brain). Of note, sensitivity is reduced in liver due to intense background estrogen receptor positivity. Electronically Signed   By: Suzy Bouchard M.D.   On: 04/28/2020 17:25   This is additional information from Dr. Ilda Foil regarding the recent estradiol F-18 scan: Shared this Estradiol PET scan with the company medical imaging expert.  He agreed with our major findings of E2 positive disease in the bones and mediastinum. He felt the "brain" uptake was probably a skull lesion with motion artifact.   Interestingly, the activity associated with the left upper lobe radiation change is a know but poorly understood association of radiotracer uptake and radiation fibrosis (not inflammation).    Nicole Kindred     ELIGIBLE FOR AVAILABLE RESEARCH PROTOCOL: To be determined  ASSESSMENT: 59 y.o. [place] woman with metastatic lobular breast cancer as follows:  (1) status post left breast overlapping and axillary lymph node biopsy 01/26/2017 for a clinical mT2 N1, stage IIA  invasive lobular carcinoma, strongly estrogen and progesterone receptor positive, HER-2 not amplified  (2) treated neoadjuvantly with docetaxel, doxorubicin and cyclophosphamide for 6 cycles, between 03/10/2017 and 07/01/2017  (3) status post bilateral mastectomies 08/25/2017 showing residual invasive lobular carcinoma, ypT3 ypN3, stage IIIB  (4) status post adjuvant radiation to the left breast and axilla 11/30/2017 through 01/10/2018.  (5) anastrozole started 09/22/2017  METASTATIC DISEASE (6) CT scan of the chest 02/11/2020, PET scan 02/18/2020 and MRI of the thoracolumbar spine 03/12/2020 show multiple bone lesions (axial and appendicular) without neural impingement and without evidence of visceral disease  (a) CT-guided right iliac crest biopsy 03/28/2020 confirms metastatic invasive lobular breast cancer, now triple negative, with an MIB-1 of 20%  (b) F-18 estradiol "cerinanna" PET scan 04/28/2020 shows stron estrogen avidity in bone lesions, questionable brain lesion, does not resolve issue of possible liver lesions  (c) liver MRI May 19, 2020 shows no evidence of metastatic disease to the liver.  (b) brain MRI May 28, 2020  (7) palbociclib and fulvestrant started 05/01/2020  (b) palbociclib held after 2 weeks because of pancytopenia  (8) zoledronate started 05/20/2020, to be repeated every 12 weeks  (9) pancytopenia, most likely secondary to bone marrow infiltration by tumor   PLAN: Alitza is feeling a little better and I am hopeful that we may be documenting some improvement.  She is still severely thrombocytopenic and we are transfusing platelets again today.  She remains anemic although her hemoglobin has been pretty stable.  Because she remains short of breath I am going to give her 1 unit of blood today.  I have asked him to resume the palbociclib.  She will take 1 tablet every other day until she is done with the remaining tablets.  That should take approximately 3  weeks.  I am setting her up for another platelet and possible blood transfusion on 05/26/2020.  She is already scheduled for a brain MRI on June 23 and she will see me on June 24 when she will receive her third dose of fulvestrant and possibly more platelets  She knows to call for any other issue that may develop before then  Total encounter time 25 minutes.Sarajane Jews C. Lawsyn Heiler, MD 05/22/2020 1:41 PM Medical Oncology and Hematology [facility] [place], [place] [address] Tel. [phone]    Fax. [phone]   This  document serves as a record of services personally performed by Lurline Del, MD. It was created on his behalf by Wilburn Mylar, a trained medical scribe. The creation of this record is based on the scribe's personal observations and the provider's statements to them.   I, Lurline Del MD, have reviewed the above documentation for accuracy and completeness, and I agree with the above.   *Total Encounter Time as defined by the Centers for Medicare and Medicaid Services includes, in addition to the face-to-face time of a patient visit (documented in the note above) non-face-to-face time: obtaining and reviewing outside history, ordering and reviewing medications, tests or procedures, care coordination (communications with other health care professionals or caregivers) and documentation in the medical record.

## 2020-05-22 ENCOUNTER — Inpatient Hospital Stay (HOSPITAL_BASED_OUTPATIENT_CLINIC_OR_DEPARTMENT_OTHER): Payer: Medicare HMO | Admitting: Oncology

## 2020-05-22 ENCOUNTER — Inpatient Hospital Stay: Payer: Medicare HMO

## 2020-05-22 ENCOUNTER — Other Ambulatory Visit: Payer: Self-pay | Admitting: *Deleted

## 2020-05-22 ENCOUNTER — Other Ambulatory Visit: Payer: Self-pay

## 2020-05-22 VITALS — BP 114/67 | HR 102 | Temp 98.3°F | Resp 18 | Ht 62.0 in | Wt 125.3 lb

## 2020-05-22 VITALS — BP 112/70 | HR 87 | Temp 97.7°F | Resp 16

## 2020-05-22 DIAGNOSIS — Z170 Estrogen receptor positive status [ER+]: Secondary | ICD-10-CM

## 2020-05-22 DIAGNOSIS — C7951 Secondary malignant neoplasm of bone: Secondary | ICD-10-CM | POA: Diagnosis not present

## 2020-05-22 DIAGNOSIS — Z79811 Long term (current) use of aromatase inhibitors: Secondary | ICD-10-CM

## 2020-05-22 DIAGNOSIS — C50912 Malignant neoplasm of unspecified site of left female breast: Secondary | ICD-10-CM

## 2020-05-22 DIAGNOSIS — D696 Thrombocytopenia, unspecified: Secondary | ICD-10-CM

## 2020-05-22 DIAGNOSIS — C773 Secondary and unspecified malignant neoplasm of axilla and upper limb lymph nodes: Secondary | ICD-10-CM

## 2020-05-22 DIAGNOSIS — C50412 Malignant neoplasm of upper-outer quadrant of left female breast: Secondary | ICD-10-CM | POA: Diagnosis not present

## 2020-05-22 DIAGNOSIS — C50812 Malignant neoplasm of overlapping sites of left female breast: Secondary | ICD-10-CM

## 2020-05-22 DIAGNOSIS — D638 Anemia in other chronic diseases classified elsewhere: Secondary | ICD-10-CM

## 2020-05-22 DIAGNOSIS — Z95828 Presence of other vascular implants and grafts: Secondary | ICD-10-CM

## 2020-05-22 DIAGNOSIS — Z5111 Encounter for antineoplastic chemotherapy: Secondary | ICD-10-CM | POA: Diagnosis not present

## 2020-05-22 DIAGNOSIS — J438 Other emphysema: Secondary | ICD-10-CM

## 2020-05-22 DIAGNOSIS — I700 Atherosclerosis of aorta: Secondary | ICD-10-CM

## 2020-05-22 LAB — CBC WITH DIFFERENTIAL/PLATELET
Abs Immature Granulocytes: 0.07 10*3/uL (ref 0.00–0.07)
Basophils Absolute: 0.1 10*3/uL (ref 0.0–0.1)
Basophils Relative: 1 %
Eosinophils Absolute: 0 10*3/uL (ref 0.0–0.5)
Eosinophils Relative: 0 %
HCT: 25.6 % — ABNORMAL LOW (ref 36.0–46.0)
Hemoglobin: 8.2 g/dL — ABNORMAL LOW (ref 12.0–15.0)
Immature Granulocytes: 1 %
Lymphocytes Relative: 81 %
Lymphs Abs: 6.4 10*3/uL — ABNORMAL HIGH (ref 0.7–4.0)
MCH: 28.3 pg (ref 26.0–34.0)
MCHC: 32 g/dL (ref 30.0–36.0)
MCV: 88.3 fL (ref 80.0–100.0)
Monocytes Absolute: 0.1 10*3/uL (ref 0.1–1.0)
Monocytes Relative: 1 %
Neutro Abs: 1.3 10*3/uL — ABNORMAL LOW (ref 1.7–7.7)
Neutrophils Relative %: 16 %
Platelets: 10 10*3/uL — ABNORMAL LOW (ref 150–400)
RBC: 2.9 MIL/uL — ABNORMAL LOW (ref 3.87–5.11)
RDW: 19.2 % — ABNORMAL HIGH (ref 11.5–15.5)
WBC: 7.9 10*3/uL (ref 4.0–10.5)
nRBC: 2.5 % — ABNORMAL HIGH (ref 0.0–0.2)

## 2020-05-22 LAB — COMPREHENSIVE METABOLIC PANEL
ALT: 27 U/L (ref 0–44)
AST: 150 U/L — ABNORMAL HIGH (ref 15–41)
Albumin: 2.2 g/dL — ABNORMAL LOW (ref 3.5–5.0)
Alkaline Phosphatase: 222 U/L — ABNORMAL HIGH (ref 38–126)
Anion gap: 7 (ref 5–15)
BUN: 10 mg/dL (ref 6–20)
CO2: 21 mmol/L — ABNORMAL LOW (ref 22–32)
Calcium: 7.1 mg/dL — ABNORMAL LOW (ref 8.9–10.3)
Chloride: 115 mmol/L — ABNORMAL HIGH (ref 98–111)
Creatinine, Ser: 0.91 mg/dL (ref 0.44–1.00)
GFR calc Af Amer: >60 mL/min (ref >60)
GFR calc non Af Amer: >60 mL/min (ref >60)
Glucose, Bld: 86 mg/dL (ref 70–99)
Potassium: 3.8 mmol/L (ref 3.5–5.1)
Sodium: 143 mmol/L (ref 135–145)
Total Bilirubin: 0.4 mg/dL (ref 0.3–1.2)
Total Protein: 5.3 g/dL — ABNORMAL LOW (ref 6.5–8.1)

## 2020-05-22 LAB — RETICULOCYTES
Immature Retic Fract: 24 % — ABNORMAL HIGH (ref 2.3–15.9)
RBC.: 2.9 MIL/uL — ABNORMAL LOW (ref 3.87–5.11)
Retic Count, Absolute: 27.8 10*3/uL (ref 19.0–186.0)
Retic Ct Pct: 1 % (ref 0.4–3.1)

## 2020-05-22 LAB — PREPARE RBC (CROSSMATCH)

## 2020-05-22 LAB — SAVE SMEAR(SSMR), FOR PROVIDER SLIDE REVIEW

## 2020-05-22 MED ORDER — ACETAMINOPHEN 325 MG PO TABS
650.0000 mg | ORAL_TABLET | Freq: Once | ORAL | Status: AC
  Administered 2020-05-22: 650 mg via ORAL

## 2020-05-22 MED ORDER — SODIUM CHLORIDE 0.9% IV SOLUTION
250.0000 mL | Freq: Once | INTRAVENOUS | Status: AC
  Administered 2020-05-22: 250 mL via INTRAVENOUS
  Filled 2020-05-22: qty 250

## 2020-05-22 MED ORDER — DIPHENHYDRAMINE HCL 25 MG PO CAPS
25.0000 mg | ORAL_CAPSULE | Freq: Once | ORAL | Status: AC
  Administered 2020-05-22: 25 mg via ORAL

## 2020-05-22 MED ORDER — HEPARIN SOD (PORK) LOCK FLUSH 100 UNIT/ML IV SOLN
500.0000 [IU] | Freq: Once | INTRAVENOUS | Status: AC
  Administered 2020-05-22: 500 [IU]
  Filled 2020-05-22: qty 5

## 2020-05-22 MED ORDER — SODIUM CHLORIDE 0.9% FLUSH
10.0000 mL | Freq: Once | INTRAVENOUS | Status: AC
  Administered 2020-05-22: 10 mL
  Filled 2020-05-22: qty 10

## 2020-05-22 MED ORDER — DIPHENHYDRAMINE HCL 25 MG PO CAPS
ORAL_CAPSULE | ORAL | Status: AC
  Filled 2020-05-22: qty 1

## 2020-05-22 MED ORDER — SODIUM CHLORIDE 0.9% FLUSH
10.0000 mL | INTRAVENOUS | Status: AC | PRN
  Administered 2020-05-22: 10 mL
  Filled 2020-05-22: qty 10

## 2020-05-22 MED ORDER — ACETAMINOPHEN 325 MG PO TABS
ORAL_TABLET | ORAL | Status: AC
  Filled 2020-05-22: qty 2

## 2020-05-22 MED ORDER — HEPARIN SOD (PORK) LOCK FLUSH 100 UNIT/ML IV SOLN
250.0000 [IU] | INTRAVENOUS | Status: DC | PRN
  Filled 2020-05-22: qty 5

## 2020-05-22 NOTE — Progress Notes (Signed)
Dr [name] Ok'd order to increase blood rate at nurses discretion to get blood in 1.5 hours.

## 2020-05-22 NOTE — Patient Instructions (Signed)

## 2020-05-23 ENCOUNTER — Telehealth: Payer: Self-pay | Admitting: Oncology

## 2020-05-23 ENCOUNTER — Inpatient Hospital Stay: Payer: Medicare HMO

## 2020-05-23 LAB — TYPE AND SCREEN
ABO/RH(D): O POS
Antibody Screen: NEGATIVE
Unit division: 0

## 2020-05-23 LAB — PREPARE PLATELET PHERESIS: Unit division: 0

## 2020-05-23 LAB — BPAM RBC
Blood Product Expiration Date: 202107182359
ISSUE DATE / TIME: 202106171526
Unit Type and Rh: 5100

## 2020-05-23 LAB — BPAM PLATELET PHERESIS
Blood Product Expiration Date: 202106172359
ISSUE DATE / TIME: 202106171528
Unit Type and Rh: 5100

## 2020-05-23 NOTE — Telephone Encounter (Signed)
Scheduled appts per 6/17 los. Pt confirmed appt date and time.  

## 2020-05-26 ENCOUNTER — Telehealth: Payer: Self-pay | Admitting: *Deleted

## 2020-05-26 ENCOUNTER — Other Ambulatory Visit: Payer: Self-pay | Admitting: *Deleted

## 2020-05-26 ENCOUNTER — Ambulatory Visit: Payer: Medicare HMO

## 2020-05-26 ENCOUNTER — Inpatient Hospital Stay: Payer: Medicare HMO

## 2020-05-26 ENCOUNTER — Other Ambulatory Visit: Payer: Medicare HMO

## 2020-05-26 ENCOUNTER — Other Ambulatory Visit: Payer: Self-pay

## 2020-05-26 DIAGNOSIS — C50812 Malignant neoplasm of overlapping sites of left female breast: Secondary | ICD-10-CM

## 2020-05-26 DIAGNOSIS — C7951 Secondary malignant neoplasm of bone: Secondary | ICD-10-CM

## 2020-05-26 DIAGNOSIS — D696 Thrombocytopenia, unspecified: Secondary | ICD-10-CM

## 2020-05-26 DIAGNOSIS — C50912 Malignant neoplasm of unspecified site of left female breast: Secondary | ICD-10-CM

## 2020-05-26 DIAGNOSIS — D638 Anemia in other chronic diseases classified elsewhere: Secondary | ICD-10-CM

## 2020-05-26 DIAGNOSIS — Z170 Estrogen receptor positive status [ER+]: Secondary | ICD-10-CM

## 2020-05-26 DIAGNOSIS — I700 Atherosclerosis of aorta: Secondary | ICD-10-CM

## 2020-05-26 DIAGNOSIS — C50412 Malignant neoplasm of upper-outer quadrant of left female breast: Secondary | ICD-10-CM

## 2020-05-26 DIAGNOSIS — Z5111 Encounter for antineoplastic chemotherapy: Secondary | ICD-10-CM | POA: Diagnosis not present

## 2020-05-26 DIAGNOSIS — J438 Other emphysema: Secondary | ICD-10-CM

## 2020-05-26 DIAGNOSIS — Z95828 Presence of other vascular implants and grafts: Secondary | ICD-10-CM

## 2020-05-26 DIAGNOSIS — C773 Secondary and unspecified malignant neoplasm of axilla and upper limb lymph nodes: Secondary | ICD-10-CM

## 2020-05-26 DIAGNOSIS — Z79811 Long term (current) use of aromatase inhibitors: Secondary | ICD-10-CM

## 2020-05-26 LAB — CBC WITH DIFFERENTIAL/PLATELET
Abs Immature Granulocytes: 0.1 10*3/uL — ABNORMAL HIGH (ref 0.00–0.07)
Basophils Absolute: 0.1 10*3/uL (ref 0.0–0.1)
Basophils Relative: 1 %
Eosinophils Absolute: 0 10*3/uL (ref 0.0–0.5)
Eosinophils Relative: 0 %
HCT: 34.8 % — ABNORMAL LOW (ref 36.0–46.0)
Hemoglobin: 11.2 g/dL — ABNORMAL LOW (ref 12.0–15.0)
Immature Granulocytes: 1 %
Lymphocytes Relative: 87 %
Lymphs Abs: 7.7 10*3/uL — ABNORMAL HIGH (ref 0.7–4.0)
MCH: 29.3 pg (ref 26.0–34.0)
MCHC: 32.2 g/dL (ref 30.0–36.0)
MCV: 91.1 fL (ref 80.0–100.0)
Monocytes Absolute: 0.1 10*3/uL (ref 0.1–1.0)
Monocytes Relative: 1 %
Neutro Abs: 0.9 10*3/uL — ABNORMAL LOW (ref 1.7–7.7)
Neutrophils Relative %: 10 %
Platelets: 7 10*3/uL — CL (ref 150–400)
RBC: 3.82 MIL/uL — ABNORMAL LOW (ref 3.87–5.11)
RDW: 18.7 % — ABNORMAL HIGH (ref 11.5–15.5)
WBC: 8.9 10*3/uL (ref 4.0–10.5)
nRBC: 2.7 % — ABNORMAL HIGH (ref 0.0–0.2)

## 2020-05-26 LAB — COMPREHENSIVE METABOLIC PANEL
ALT: 60 U/L — ABNORMAL HIGH (ref 0–44)
AST: 316 U/L (ref 15–41)
Albumin: 2.3 g/dL — ABNORMAL LOW (ref 3.5–5.0)
Alkaline Phosphatase: 271 U/L — ABNORMAL HIGH (ref 38–126)
Anion gap: 11 (ref 5–15)
BUN: 8 mg/dL (ref 6–20)
CO2: 21 mmol/L — ABNORMAL LOW (ref 22–32)
Calcium: 7.9 mg/dL — ABNORMAL LOW (ref 8.9–10.3)
Chloride: 116 mmol/L — ABNORMAL HIGH (ref 98–111)
Creatinine, Ser: 1.01 mg/dL — ABNORMAL HIGH (ref 0.44–1.00)
GFR calc Af Amer: >60 mL/min (ref >60)
GFR calc non Af Amer: >60 mL/min (ref >60)
Glucose, Bld: 87 mg/dL (ref 70–99)
Potassium: 4.3 mmol/L (ref 3.5–5.1)
Sodium: 148 mmol/L — ABNORMAL HIGH (ref 135–145)
Total Bilirubin: 0.5 mg/dL (ref 0.3–1.2)
Total Protein: 5.6 g/dL — ABNORMAL LOW (ref 6.5–8.1)

## 2020-05-26 MED ORDER — SODIUM CHLORIDE 0.9% FLUSH
10.0000 mL | INTRAVENOUS | Status: AC | PRN
  Administered 2020-05-26: 10 mL
  Filled 2020-05-26: qty 10

## 2020-05-26 MED ORDER — SODIUM CHLORIDE 0.9% FLUSH
10.0000 mL | Freq: Once | INTRAVENOUS | Status: AC
  Administered 2020-05-26: 10 mL
  Filled 2020-05-26: qty 10

## 2020-05-26 MED ORDER — DIPHENHYDRAMINE HCL 25 MG PO CAPS
ORAL_CAPSULE | ORAL | Status: AC
  Filled 2020-05-26: qty 1

## 2020-05-26 MED ORDER — DIPHENHYDRAMINE HCL 25 MG PO CAPS
25.0000 mg | ORAL_CAPSULE | Freq: Once | ORAL | Status: AC
  Administered 2020-05-26: 25 mg via ORAL

## 2020-05-26 MED ORDER — HEPARIN SOD (PORK) LOCK FLUSH 100 UNIT/ML IV SOLN
500.0000 [IU] | Freq: Every day | INTRAVENOUS | Status: AC | PRN
  Administered 2020-05-26: 500 [IU]
  Filled 2020-05-26: qty 5

## 2020-05-26 MED ORDER — ACETAMINOPHEN 325 MG PO TABS
650.0000 mg | ORAL_TABLET | Freq: Once | ORAL | Status: AC
  Administered 2020-05-26: 650 mg via ORAL

## 2020-05-26 MED ORDER — SODIUM CHLORIDE 0.9% IV SOLUTION
250.0000 mL | Freq: Once | INTRAVENOUS | Status: AC
  Administered 2020-05-26: 250 mL via INTRAVENOUS
  Filled 2020-05-26: qty 250

## 2020-05-26 MED ORDER — ACETAMINOPHEN 325 MG PO TABS
ORAL_TABLET | ORAL | Status: AC
  Filled 2020-05-26: qty 2

## 2020-05-26 NOTE — Telephone Encounter (Signed)
Called lab report for platelets of 7,000.  Pt scheduled for transfusion.  Per MD order for plts given and placed by this RN.  This RN called blood bank and verified released prepare was visible.  Platelets are being prepared now.

## 2020-05-26 NOTE — Patient Instructions (Signed)
Thrombocytopenia Thrombocytopenia means that you have a low number of platelets in your blood. Platelets are tiny cells in the blood. When you bleed, they clump together at the cut or injury to stop the bleeding. This is called blood clotting. If you do not have enough platelets, it can cause bleeding problems. Some cases of this condition are mild while others are more severe. What are the causes? This condition may be caused by:  Your body not making enough platelets. This may be caused by: ? Your bone marrow not making blood cells (aplastic anemia). ? Cancer in the bone marrow. ? Certain medicines. ? Infection in the bone marrow. ? Drinking a lot of alcohol.  Your body destroying platelets too quickly. This may be caused by: ? Certain immune diseases. ? Certain medicines. ? Certain blood clotting disorders. ? Certain disorders that are passed from parent to child (inherited). ? Certain bleeding disorders. ? Pregnancy. ? Having a spleen that is larger than normal. What are the signs or symptoms?  Bleeding that is not normal.  Nosebleeds.  Heavy menstrual periods.  Blood in the pee (urine) or poop (stool).  A purple-like color to the skin (purpura).  Bruising.  A rash that looks like pinpoint, purple-red spots (petechiae). How is this treated?  Treatment of another condition that is causing the low platelet count.  Medicines to help protect your platelets from being destroyed.  A replacement (transfusion) of platelets to stop or prevent bleeding.  Surgery to remove the spleen. Follow these instructions at home: Activity  Avoid activities that could cause you to get hurt or bruised. Follow instructions about how to prevent falls.  Take care not to cut yourself: ? When you shave. ? When you use scissors, needles, knives, or other tools.  Take care not to burn yourself: ? When you use an iron. ? When you cook. General instructions   Check your skin and the  inside of your mouth for bruises or blood as told by your doctor.  Check to see if there is blood in your spit (sputum), pee, and poop. Do this as told by your doctor.  Do not drink alcohol.  Take over-the-counter and prescription medicines only as told by your doctor.  Do not take any medicines that have aspirin or NSAIDs in them. These medicines can thin your blood and cause you to bleed.  Tell all of your doctors that you have this condition. Be sure to tell your dentist and eye doctor too. Contact a doctor if:  You have bruises and you do not know why. Get help right away if:  You are bleeding anywhere on your body.  You have blood in your spit, pee, or poop. Summary  Thrombocytopenia means that you have a low number of platelets in your blood.  Platelets are needed for blood clotting.  Symptoms of this condition include bleeding that is not normal, and bruising.  Take care not to cut or burn yourself. This information is not intended to replace advice given to you by your health care provider. Make sure you discuss any questions you have with your health care provider. Document Revised: 08/24/2018 Document Reviewed: 08/24/2018 Elsevier Patient Education  2020 [employer] Inc.  

## 2020-05-26 NOTE — Telephone Encounter (Signed)
Received call from lab with panic platelet level of 7k.  Call made to [facility], RN with Dr. [name] and made her aware.

## 2020-05-26 NOTE — Patient Instructions (Signed)

## 2020-05-27 LAB — PREPARE PLATELET PHERESIS: Unit division: 0

## 2020-05-27 LAB — BPAM PLATELET PHERESIS
Blood Product Expiration Date: 202106212359
ISSUE DATE / TIME: 202106211503
Unit Type and Rh: 5100

## 2020-05-27 LAB — TYPE AND SCREEN
ABO/RH(D): O POS
Antibody Screen: NEGATIVE

## 2020-05-28 ENCOUNTER — Ambulatory Visit (HOSPITAL_COMMUNITY)
Admission: RE | Admit: 2020-05-28 | Discharge: 2020-05-28 | Disposition: A | Discharge status: Home / Self Care | Payer: Medicare HMO | Source: Ambulatory Visit | Attending: Oncology | Admitting: Oncology

## 2020-05-28 ENCOUNTER — Other Ambulatory Visit: Payer: Self-pay | Admitting: Oncology

## 2020-05-28 ENCOUNTER — Other Ambulatory Visit: Payer: Self-pay

## 2020-05-28 DIAGNOSIS — Z7189 Other specified counseling: Secondary | ICD-10-CM | POA: Insufficient documentation

## 2020-05-28 DIAGNOSIS — C7951 Secondary malignant neoplasm of bone: Secondary | ICD-10-CM | POA: Insufficient documentation

## 2020-05-28 DIAGNOSIS — Z170 Estrogen receptor positive status [ER+]: Secondary | ICD-10-CM | POA: Insufficient documentation

## 2020-05-28 DIAGNOSIS — D696 Thrombocytopenia, unspecified: Secondary | ICD-10-CM | POA: Insufficient documentation

## 2020-05-28 DIAGNOSIS — C50912 Malignant neoplasm of unspecified site of left female breast: Secondary | ICD-10-CM | POA: Insufficient documentation

## 2020-05-28 DIAGNOSIS — C50812 Malignant neoplasm of overlapping sites of left female breast: Secondary | ICD-10-CM | POA: Diagnosis present

## 2020-05-28 MED ORDER — GADOBUTROL 1 MMOL/ML IV SOLN
6.0000 mL | Freq: Once | INTRAVENOUS | Status: AC | PRN
  Administered 2020-05-28: 6 mL via INTRAVENOUS

## 2020-05-29 ENCOUNTER — Inpatient Hospital Stay: Payer: Medicare HMO

## 2020-05-29 ENCOUNTER — Inpatient Hospital Stay (HOSPITAL_BASED_OUTPATIENT_CLINIC_OR_DEPARTMENT_OTHER): Payer: Medicare HMO | Admitting: Oncology

## 2020-05-29 ENCOUNTER — Other Ambulatory Visit: Payer: Self-pay

## 2020-05-29 ENCOUNTER — Other Ambulatory Visit: Payer: Self-pay | Admitting: *Deleted

## 2020-05-29 ENCOUNTER — Other Ambulatory Visit: Payer: Medicare HMO

## 2020-05-29 VITALS — BP 126/67 | HR 95 | Temp 97.8°F | Resp 17

## 2020-05-29 DIAGNOSIS — J438 Other emphysema: Secondary | ICD-10-CM

## 2020-05-29 DIAGNOSIS — C7951 Secondary malignant neoplasm of bone: Secondary | ICD-10-CM

## 2020-05-29 DIAGNOSIS — Z95828 Presence of other vascular implants and grafts: Secondary | ICD-10-CM

## 2020-05-29 DIAGNOSIS — Z5111 Encounter for antineoplastic chemotherapy: Secondary | ICD-10-CM | POA: Diagnosis not present

## 2020-05-29 DIAGNOSIS — C50412 Malignant neoplasm of upper-outer quadrant of left female breast: Secondary | ICD-10-CM

## 2020-05-29 DIAGNOSIS — C50812 Malignant neoplasm of overlapping sites of left female breast: Secondary | ICD-10-CM

## 2020-05-29 DIAGNOSIS — C50912 Malignant neoplasm of unspecified site of left female breast: Secondary | ICD-10-CM

## 2020-05-29 DIAGNOSIS — D696 Thrombocytopenia, unspecified: Secondary | ICD-10-CM

## 2020-05-29 DIAGNOSIS — I700 Atherosclerosis of aorta: Secondary | ICD-10-CM

## 2020-05-29 DIAGNOSIS — Z170 Estrogen receptor positive status [ER+]: Secondary | ICD-10-CM

## 2020-05-29 DIAGNOSIS — Z79811 Long term (current) use of aromatase inhibitors: Secondary | ICD-10-CM

## 2020-05-29 DIAGNOSIS — C773 Secondary and unspecified malignant neoplasm of axilla and upper limb lymph nodes: Secondary | ICD-10-CM

## 2020-05-29 DIAGNOSIS — D649 Anemia, unspecified: Secondary | ICD-10-CM

## 2020-05-29 LAB — COMPREHENSIVE METABOLIC PANEL
ALT: 40 U/L (ref 0–44)
AST: 272 U/L (ref 15–41)
Albumin: 2.3 g/dL — ABNORMAL LOW (ref 3.5–5.0)
Alkaline Phosphatase: 264 U/L — ABNORMAL HIGH (ref 38–126)
Anion gap: 10 (ref 5–15)
BUN: 9 mg/dL (ref 6–20)
CO2: 20 mmol/L — ABNORMAL LOW (ref 22–32)
Calcium: 7.9 mg/dL — ABNORMAL LOW (ref 8.9–10.3)
Chloride: 114 mmol/L — ABNORMAL HIGH (ref 98–111)
Creatinine, Ser: 1.03 mg/dL — ABNORMAL HIGH (ref 0.44–1.00)
GFR calc Af Amer: >60 mL/min (ref >60)
GFR calc non Af Amer: 59 mL/min — ABNORMAL LOW (ref >60)
Glucose, Bld: 99 mg/dL (ref 70–99)
Potassium: 4.3 mmol/L (ref 3.5–5.1)
Sodium: 144 mmol/L (ref 135–145)
Total Bilirubin: 0.4 mg/dL (ref 0.3–1.2)
Total Protein: 5.5 g/dL — ABNORMAL LOW (ref 6.5–8.1)

## 2020-05-29 LAB — CBC WITH DIFFERENTIAL (CANCER CENTER ONLY)
Abs Immature Granulocytes: 0.06 10*3/uL (ref 0.00–0.07)
Basophils Absolute: 0.1 10*3/uL (ref 0.0–0.1)
Basophils Relative: 1 %
Eosinophils Absolute: 0 10*3/uL (ref 0.0–0.5)
Eosinophils Relative: 1 %
HCT: 33.5 % — ABNORMAL LOW (ref 36.0–46.0)
Hemoglobin: 10.7 g/dL — ABNORMAL LOW (ref 12.0–15.0)
Immature Granulocytes: 1 %
Lymphocytes Relative: 81 %
Lymphs Abs: 6.2 10*3/uL — ABNORMAL HIGH (ref 0.7–4.0)
MCH: 29 pg (ref 26.0–34.0)
MCHC: 31.9 g/dL (ref 30.0–36.0)
MCV: 90.8 fL (ref 80.0–100.0)
Monocytes Absolute: 0.1 10*3/uL (ref 0.1–1.0)
Monocytes Relative: 1 %
Neutro Abs: 1.2 10*3/uL — ABNORMAL LOW (ref 1.7–7.7)
Neutrophils Relative %: 15 %
Platelet Count: 22 10*3/uL — ABNORMAL LOW (ref 150–400)
RBC Morphology: 6
RBC: 3.69 MIL/uL — ABNORMAL LOW (ref 3.87–5.11)
RDW: 18.7 % — ABNORMAL HIGH (ref 11.5–15.5)
WBC Count: 7.7 10*3/uL (ref 4.0–10.5)
nRBC: 2.1 % — ABNORMAL HIGH (ref 0.0–0.2)

## 2020-05-29 LAB — SAMPLE TO BLOOD BANK

## 2020-05-29 MED ORDER — DIPHENHYDRAMINE HCL 25 MG PO CAPS
25.0000 mg | ORAL_CAPSULE | Freq: Once | ORAL | Status: AC
  Administered 2020-05-29: 25 mg via ORAL

## 2020-05-29 MED ORDER — SODIUM CHLORIDE 0.9% FLUSH
10.0000 mL | Freq: Once | INTRAVENOUS | Status: AC
  Administered 2020-05-29: 10 mL
  Filled 2020-05-29: qty 10

## 2020-05-29 MED ORDER — FULVESTRANT 250 MG/5ML IM SOLN
INTRAMUSCULAR | Status: AC
  Filled 2020-05-29: qty 10

## 2020-05-29 MED ORDER — ACETAMINOPHEN 325 MG PO TABS
650.0000 mg | ORAL_TABLET | Freq: Once | ORAL | Status: AC
  Administered 2020-05-29: 650 mg via ORAL

## 2020-05-29 MED ORDER — ACETAMINOPHEN 325 MG PO TABS
ORAL_TABLET | ORAL | Status: AC
  Filled 2020-05-29: qty 2

## 2020-05-29 MED ORDER — DIPHENHYDRAMINE HCL 25 MG PO CAPS
ORAL_CAPSULE | ORAL | Status: AC
  Filled 2020-05-29: qty 2

## 2020-05-29 MED ORDER — HEPARIN SOD (PORK) LOCK FLUSH 100 UNIT/ML IV SOLN
500.0000 [IU] | Freq: Once | INTRAVENOUS | Status: AC
  Administered 2020-05-29: 500 [IU]
  Filled 2020-05-29: qty 5

## 2020-05-29 MED ORDER — SODIUM CHLORIDE 0.9% IV SOLUTION
250.0000 mL | Freq: Once | INTRAVENOUS | Status: DC
  Filled 2020-05-29: qty 250

## 2020-05-29 MED ORDER — PALBOCICLIB 75 MG PO TABS: 75.0000 mg | ORAL_TABLET | Freq: Every day | ORAL | 6 refills | Status: DC

## 2020-05-29 MED ORDER — FULVESTRANT 250 MG/5ML IM SOLN
500.0000 mg | Freq: Once | INTRAMUSCULAR | Status: AC
  Administered 2020-05-29: 500 mg via INTRAMUSCULAR

## 2020-05-29 NOTE — Patient Instructions (Signed)
Platelet Count Test Why am I having this test? Platelets are specialized cells that help the blood clot. When you get a tissue injury like a cut, platelets gather at the site of the injury to stop the bleeding. You may have a platelet count test:  If you have symptoms that may be related to excess bleeding or delayed blood clotting, such as: ? A rash of pinprick-sized red and purple dots on the skin (petechiae). These are small collections of blood (hemorrhages) in the skin. ? Heavy menstrual bleeding.  To help monitor treatment for: ? Thrombocytopenia. This is a condition in which you have a low platelet count. ? Bone marrow failure. What is being tested? This test measures how many platelets you have within a specific amount (volume) of blood. What kind of sample is taken?  A blood sample is required for this test. It is usually collected by inserting a needle into a blood vessel or by sticking a finger with a small needle. Tell a health care provider about:  Any allergies you have.  All medicines you are taking, including vitamins, herbs, eye drops, creams, and over-the-counter medicines.  Any blood disorders you have.  Any surgeries you have had.  Any medical conditions you have.  Whether you are pregnant or may be pregnant. How are the results reported? Your test results will be reported as a value that indicates how many platelets are in the blood volume. This will be given as platelets per cubic millimeter (mm3) of blood. Your health care provider will compare your results to normal ranges that were established after testing a large group of people (reference ranges). Reference ranges may vary among labs and hospitals. For this test, common reference ranges are:  Adult or elderly: 150,000-400,000/mm3.  Child: 150,000-400,000/mm3.  Infant: 200,000-475,000/mm3.  Premature infant: 100,000-300,000/mm3.  Newborn: 150,000-300,000/mm3. What do the results mean? A result  that is within your reference range is considered normal, meaning that you have a normal amount of platelets in your blood. A result that is higher than your reference range means that you have too many platelets in your blood. This may mean that you have:  Certain types of cancer, such as leukemia or lymphoma.  A condition in which the bone marrow produces excess amounts of all cell types, including platelets (polycythemia vera).  A condition that can occur after surgery to remove the spleen (post-splenectomy syndrome).  Rheumatoid arthritis.  Anemia due to lack of iron (iron-deficiency anemia). A result that is lower than your reference range means that you have too few platelets in your blood. This may mean that you have:  A condition in which the spleen breaks down platelets faster than normal (hypersplenism).  A hemorrhage somewhere in your body.  Low platelet count due to your body's disease-fighting system attacking your platelets (immune thrombocytopenia).  Cancer. Chemotherapy treatments for cancers such as leukemia can also cause low platelet count.  A rare, serious form of thrombocytopenia that causes blood clots (thrombotic thrombocytopenia).  HELLP syndrome, a disorder of pregnancy that causes high blood pressure and other serious problems.  Certain disorders that are passed from parent to child (inherited) that cause a low platelet count.  A condition in which the proteins that control blood clotting are overactive, causing abnormal clotting processes to occur (disseminated intravascular coagulation, DIC).  A disease that causes long-term inflammation and pain in many parts of the body (systemic lupus erythematosus, SLE).  Certain types of anemia, such as pernicious anemia or hemolytic anemia.    Infection. Talk with your health care provider about what your results mean. Questions to ask your health care provider Ask your health care provider, or the department that  is doing the test:  When will my results be ready?  How will I get my results?  What are my treatment options?  What other tests do I need?  What are my next steps? Summary  Platelets are specialized cells that help the blood clot. When you get a tissue injury like a cut, platelets gather at the site of the injury to stop the bleeding.  This test measures how many platelets you have within a specific amount (volume) of blood.  Talk with your health care provider about what your results mean. This information is not intended to replace advice given to you by your health care provider. Make sure you discuss any questions you have with your health care provider. Document Revised: 08/15/2017 Document Reviewed: 08/15/2017 Elsevier Patient Education  2020 Elsevier Inc.  

## 2020-05-29 NOTE — Progress Notes (Signed)
[place]  Telephone:(336) [phone] Fax:(336) [phone]     ID: Jamie Hartman DOB: 1961-09-12  MR#: [id number]  VEL#:[id number]  Patient Care Team: Edmonia James, PA-C as PCP - General (Physician Assistant) Beverely Pace, MD as Referring Physician (Surgical Oncology) Milynn Quirion, Virgie Dad, MD as Consulting Physician (Oncology) Chauncey Cruel, MD OTHER MD:  CHIEF COMPLAINT: Invasive lobular breast cancer, stage IV, estrogen receptor positive; pancytopenia  CURRENT TREATMENT: Fulvestrant; palbociclib; zoledronate   INTERVAL HISTORY: Jamie Hartman today for follow up of her triple negative breast cancer accompanied by her husband Jamie Hartman.   Since her last visit, she underwent brain MRI yesterday, 05/28/2020, results still pending but with no obvious metastasis by my preliminary review.  She started fulvestrant May 01, 2020.  Today is her third dose.  She received zoledronate on 05/20/2020.  She received 3.5 mg.  She tolerated this with no unusual side effects  Her palbociclib was briefly held because of cytopenias.  Currently she is taking 125 mg every other day and has another week to go before her "off" week.  She had some nausea with this initially but that appears to be improved.Marland Kitchen   REVIEW OF SYSTEMS: Jamie Hartman was started on Remeron for her lack of appetite but instead of making her sleepy she says it woke her up so she stopped it.  She is managing to eat some rice, pudding, applesauce, and Ensure.  She has had no bleeding or bruising, no intercurrent fevers, no rash.  She is not taking any supplements that might explain her elevated liver function tests and she does not drink alcohol.   HISTORY OF CURRENT ILLNESS: From the original intake note:  Jamie Hartman 's breast cancer history dates back to February 2018 when she noted left nipple retraction.  Her subsequent evaluation and treatment is detailed below, but basically she received neoadjuvant chemotherapy with  docetaxel doxorubicin and cyclophosphamide and proceeded to mastectomy which unfortunately showed T3 N2 residual disease, with all 10 axillary lymph nodes removed positive.  She received adjuvant radiation and then was started on anastrozole.  More recently the had a CT scan of the chest with contrast to evaluate her ongoing left-sided chest wall pain.  This showed several new sclerotic lesions in the thoracic vertebrae but no visceral disease.  PET scan 02/18/2020 showed again no visceral uptake but widespread bony metastatic disease through the axial and appendicular skeleton.  Thoracic and lumbar spine MRI confirmed multiple areas of metastatic disease to bone without evidence of neural impingement.  On 03/28/2020 the patient underwent CT-guided biopsy of the right iliac bone, and this showed (HBS [phone]) metastatic carcinoma consistent with lobular breast cancer, but now triple negative, with an MIB-1 of 20%.  Her subsequent history is as detailed below   PAST MEDICAL HISTORY: Past Medical History:  Diagnosis Date  . Breast cancer ([place]) 01/2017   metastatic lobular carcinoma    PAST SURGICAL HISTORY: Past Surgical History:  Procedure Laterality Date  . BILATERAL TOTAL MASTECTOMY WITH AXILLARY LYMPH NODE DISSECTION  09/04/2017  . LEEP      FAMILY HISTORY: Family History  Problem Relation Age of Onset  . Emphysema Mother   . Diabetes Father   . Breast cancer Maternal Aunt   . Breast cancer Paternal Aunt   . Pancreatic cancer Maternal Grandmother   The patient's father died at 56 from complications of diabetes.  He had a sister diagnosed with breast cancer.  The patient's mother died at 49 from complications of  emphysema.  Her mother, the patient's maternal grandmother, had pancreatic cancer and a maternal aunt had a history of breast cancer.  The patient herself has a brother, with no history of cancer.  She has no sisters.   GYNECOLOGIC HISTORY:  No LMP recorded. Patient is  postmenopausal. Menarche: 59 years old GX P 0 LMP 50 HRT no  Hysterectomy?  No BSO?  No   SOCIAL HISTORY: (updated May 2021)  Emaly lives with her husband Jamie Hartman and 4 cats.  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    ADVANCED DIRECTIVES: In the absence of any document to the contrary the patient's husband is her healthcare power of attorney.   HEALTH MAINTENANCE: Social History   Tobacco Use  . Smoking status: Current Every Day Smoker    Packs/day: 0.50    Types: Cigarettes  . Smokeless tobacco: Never Used  Substance Use Topics  . Alcohol use: Yes  . Drug use: Not on file     Colonoscopy: 2020  PAP: Up-to-date  Bone density: 2019   Allergies  Allergen Reactions  . Pantoprazole Nausea Only, Other (See Comments) and Shortness Of Breath    Leg cramps, headaches Leg cramps, headaches Leg cramps, headaches Leg cramps, headaches   . Adhesive  [Tape] Dermatitis    Band aids  . Avelox [Moxifloxacin Hcl In Nacl]     Severe headache  . Ciprofloxacin Rash    Current Outpatient Medications  Medication Sig Dispense Refill  . cholecalciferol (VITAMIN D3) 25 MCG (1000 UNIT) tablet Take 1 tablet (1,000 Units total) by mouth daily.    . hydrOXYzine (ATARAX/VISTARIL) 25 MG tablet Take 1 tablet (25 mg total) by mouth at bedtime and may repeat dose one time if needed. 60 tablet 0  . IBRANCE 125 MG tablet TAKE 1 TABLET (125 MG TOTAL) BY MOUTH DAILY. TAKE FOR 21 DAYS ON, 7 DAYS OFF, REPEAT EVERY 28 DAYS. 21 tablet 0  . mirtazapine (REMERON) 7.5 MG tablet Take 1 tablet (7.5 mg total) by mouth at bedtime. 30 tablet 1  . ondansetron (ZOFRAN-ODT) 8 MG disintegrating tablet Take by mouth.    . prochlorperazine (COMPAZINE) 10 MG tablet Take 0.5 tablets (5 mg total) by mouth every 6 (six) hours as needed for nausea or vomiting. 30 tablet   . promethazine (PHENERGAN) 25 MG suppository Place 1 suppository (25 mg total) rectally every 6 (six) hours as needed for nausea or vomiting. 12 each 3  .  valACYclovir (VALTREX) 500 MG tablet 1 tablet bid x 3 days    . Zoledronic Acid (ZOMETA) 4 MG/100ML IVPB Inject into the vein.     No current facility-administered medications for this visit.   Facility-Administered Medications Ordered in Other Visits  Medication Dose Route Frequency Provider Last Rate Last Admin  . 0.9 %  sodium chloride infusion (Manually program via Guardrails IV Fluids)  250 mL Intravenous Once Arletha Marschke, Virgie Dad, MD        OBJECTIVE: White woman examined in the treatment area  There were no vitals filed for this visit.   There is no height or weight on file to calculate BMI.   Wt Readings from Last 3 Encounters:  05/22/20 125 lb 4.8 oz (56.8 kg)  05/15/20 120 lb 14.4 oz (54.8 kg)  05/01/20 122 lb 3.2 oz (55.4 kg)  For vitals associated with the 05/29/2028 visit please see the infusion area flowsheet   ECOG FS:2 - Symptomatic, <50% confined to bed  Sclerae unicteric, EOMs intact Wearing a mask Lungs  no rales or rhonchi Heart regular rate and rhythm Abd soft, nontender, positive bowel sounds Neuro: nonfocal, well oriented, appropriate affect Breasts: Deferred   LAB RESULTS:  CMP     Component Value Date/Time   NA 144 05/29/2020 1400   K 4.3 05/29/2020 1400   CL 114 (H) 05/29/2020 1400   CO2 20 (L) 05/29/2020 1400   GLUCOSE 99 05/29/2020 1400   BUN 9 05/29/2020 1400   CREATININE 1.03 (H) 05/29/2020 1400   CREATININE 0.90 05/01/2020 1523   CALCIUM 7.9 (L) 05/29/2020 1400   PROT 5.5 (L) 05/29/2020 1400   ALBUMIN 2.3 (L) 05/29/2020 1400   AST 272 (HH) 05/29/2020 1400   AST 561 (HH) 05/01/2020 1523   ALT 40 05/29/2020 1400   ALT 326 (HH) 05/01/2020 1523   ALKPHOS 264 (H) 05/29/2020 1400   BILITOT 0.4 05/29/2020 1400   BILITOT 0.8 05/01/2020 1523   GFRNONAA 59 (L) 05/29/2020 1400   GFRNONAA >60 05/01/2020 1523   GFRAA >60 05/29/2020 1400   GFRAA >60 05/01/2020 1523    No results found for: TOTALPROTELP, ALBUMINELP, A1GS, A2GS, BETS, BETA2SER,  GAMS, MSPIKE, SPEI  Lab Results  Component Value Date   WBC 7.7 05/29/2020   NEUTROABS 1.2 (L) 05/29/2020   HGB 10.7 (L) 05/29/2020   HCT 33.5 (L) 05/29/2020   MCV 90.8 05/29/2020   PLT 22 (L) 05/29/2020    No results found for: LABCA2  No components found for: RCVELF810  No results for input(s): INR in the last 168 hours.  No results found for: LABCA2  No results found for: FBP102  No results found for: HEN277  Lab Results  Component Value Date   OEU235 14,683.0 (H) 05/20/2020    No results found for: CA2729  No components found for: HGQUANT  No results found for: CEA1 / No results found for: CEA1   No results found for: AFPTUMOR  No results found for: CHROMOGRNA  No results found for: KPAFRELGTCHN, LAMBDASER, KAPLAMBRATIO (kappa/lambda light chains)  No results found for: HGBA, HGBA2QUANT, HGBFQUANT, HGBSQUAN (Hemoglobinopathy evaluation)   No results found for: LDH  No results found for: IRON, TIBC, IRONPCTSAT (Iron and TIBC)  No results found for: FERRITIN  Urinalysis    Component Value Date/Time   BILIRUBINUR negative 03/04/2016 1453   KETONESUR trace (5) (A) 03/04/2016 1453   PROTEINUR negative 03/04/2016 1453   UROBILINOGEN 0.2 03/04/2016 1453   NITRITE Negative 03/04/2016 1453   LEUKOCYTESUR Negative 03/04/2016 1453    STUDIES: MR LIVER W WO CONTRAST  Result Date: 05/19/2020 CLINICAL DATA:  Lobular breast cancer. Elevated liver function tests. Evaluate for liver metastasis. EXAM: MRI ABDOMEN WITHOUT AND WITH CONTRAST TECHNIQUE: Multiplanar multisequence MR imaging of the abdomen was performed both before and after the administration of intravenous contrast. CONTRAST:  90m GADAVIST GADOBUTROL 1 MMOL/ML IV SOLN COMPARISON:  [facility] PET 02/18/2020. [facility] 03/17/2017 abdominopelvic CT. FINDINGS: Portions of exam are mildly motion degraded. Lower chest: Moderate left and small right pleural effusions. Normal heart size.  Bilateral breast implants. Hepatobiliary: High left hepatic lobe 8 mm cyst, present back 03/17/2017. No suspicious liver lesion. No significant stenosis. Normal gallbladder, without biliary ductal dilatation. Pancreas:  Normal, without mass or ductal dilatation. Spleen:  Borderline splenomegaly, 12.9 cm craniocaudal. Adrenals/Urinary Tract: Normal adrenal glands. Normal kidneys, without hydronephrosis. Stomach/Bowel: Normal stomach and abdominal bowel loops. Vascular/Lymphatic: Aortic atherosclerosis. No retroperitoneal or retrocrural adenopathy. Other:  No ascites. Musculoskeletal: Relatively diffuse osseous metastasis, as evidenced by heterogeneous enhancement  throughout. IMPRESSION: 1. Mildly motion degraded exam. 2. Given this limitation, no evidence of hepatic metastasis or explanation for elevated liver function tests. 3. Widespread osseous metastasis. 4. Left larger than right pleural effusions. 5.  Aortic Atherosclerosis (ICD10-I70.0). Electronically Signed   By: Abigail Miyamoto M.D.   On: 05/19/2020 14:09    ELIGIBLE FOR AVAILABLE RESEARCH PROTOCOL: To be determined  ASSESSMENT: 59 y.o. [place] woman with metastatic lobular breast cancer as follows:  (1) status post left breast overlapping and axillary lymph node biopsy 01/26/2017 for a clinical mT2 N1, stage IIA invasive lobular carcinoma, strongly estrogen and progesterone receptor positive, HER-2 not amplified  (2) treated neoadjuvantly with docetaxel, doxorubicin and cyclophosphamide for 6 cycles, between 03/10/2017 and 07/01/2017  (3) status post bilateral mastectomies 08/25/2017 showing residual invasive lobular carcinoma, ypT3 ypN3, stage IIIB  (4) status post adjuvant radiation to the left breast and axilla 11/30/2017 through 01/10/2018.  (5) anastrozole started 09/22/2017  METASTATIC DISEASE (6) CT scan of the chest 02/11/2020, PET scan 02/18/2020 and MRI of the thoracolumbar spine 03/12/2020 show multiple bone lesions (axial  and appendicular) without neural impingement and without evidence of visceral disease  (a) CT-guided right iliac crest biopsy 03/28/2020 confirms metastatic invasive lobular breast cancer, now triple negative, with an MIB-1 of 20%  (b) F-18 estradiol "cerinanna" PET scan 04/28/2020 shows stron estrogen avidity in bone lesions, questionable brain lesion, does not resolve issue of possible liver lesions  (c) liver MRI 05/19/2020 shows no evidence of metastatic disease to the liver.  (b) brain MRI 05/28/2020 - by preliminary reading  (7) palbociclib and fulvestrant started 05/01/2020  (b) palbociclib held after 2 weeks because of pancytopenia, resumed 125 mg every other day  (8) zoledronate started 05/20/2020 3.5 mg, to be repeated every 12 weeks  (9) pancytopenia, most likely secondary to bone marrow infiltration by tumor   PLAN: Kinleigh's counts are actually better.  The platelets are still low but or 20,000.  The transaminases are trending down.  The ALT is now normal.  The AST is 272.  I have no explanation for this.  We obtained a liver MRI after much arguing with her insurance and that does not show evidence of metastatic disease to the liver.  She had a brain MRI as well and she understands my reading is that there is no obvious malignancy there but we will wait for the formal radiologic reading.  At this point she appears to be tolerating treatment well, and we are having some initial signs of possible response.  I suggest that she take the Remeron in the morning since it seems to river up instead of making her sleepy.  If after a while it starts making her somnolent she will be referred to taking it at bedtime.  I have asked her not to take any supplements (and she tells me she is not taking any) so we do not get confused possible reasons for her function tests.  I am adding viral studies to her next set of labs.  We will change her palbociclib dose to 75 mg daily, 21 on 7 off, beginning  with the next cycle  Total encounter time 55 minutes.Sarajane Jews C. Jadae Steinke, MD 05/29/2020 5:14 PM Medical Oncology and Hematology [facility] [place], [place] [address] Tel. [phone]    Fax. [phone]   This document serves as a record of services personally performed by Lurline Del, MD. It was created on his behalf by Wilburn Mylar,  a trained medical scribe. The creation of this record is based on the scribe's personal observations and the provider's statements to them.   I, Lurline Del MD, have reviewed the above documentation for accuracy and completeness, and I agree with the above.   *Total Encounter Time as defined by the Centers for Medicare and Medicaid Services includes, in addition to the face-to-face time of a patient visit (documented in the note above) non-face-to-face time: obtaining and reviewing outside history, ordering and reviewing medications, tests or procedures, care coordination (communications with other health care professionals or caregivers) and documentation in the medical record.

## 2020-05-30 ENCOUNTER — Encounter: Payer: Self-pay | Admitting: General Practice

## 2020-05-30 LAB — PREPARE PLATELET PHERESIS: Unit division: 0

## 2020-05-30 LAB — BPAM PLATELET PHERESIS
Blood Product Expiration Date: 202106262359
ISSUE DATE / TIME: 202106241559
Unit Type and Rh: 5100

## 2020-05-30 NOTE — Progress Notes (Signed)
[name] CSW Progress Notes  Received request from registration to connect w patient as she wants to see Social Work, patient followed by CSW [facility] who was not in building.  Determined patient wanted to drop off paperwork for [employer] [place] application - obtained these from husband in lobby and placed on [place] desk. Also provided second $50 disbursement from [employer].  Edwyna Shell, LCSW Clinical Social Worker Phone:  [phone] Cell:  [phone]

## 2020-06-02 ENCOUNTER — Telehealth: Payer: Self-pay | Admitting: Oncology

## 2020-06-02 NOTE — Telephone Encounter (Signed)
Scheduled appts per 6/24 los. Left voicemail with appt date and time. Mailed appt reminder and calendar.

## 2020-06-04 ENCOUNTER — Telehealth: Payer: Self-pay | Admitting: *Deleted

## 2020-06-04 NOTE — Telephone Encounter (Signed)
This RN spoke with Jamie Hartman and the patient per their call.  Jamie Hartman is vomiting 30 minutes post taking the Ibrance.  She is using a phenergan suppository 30 minutes prior.  She states presently " even thinking about taking it - makes me nauseated."  She has intermittent nausea through out the day with occasional vomiting but mostly dry heaves, except with taking the Ibrance.  She is able eat and drink primarily " cold food and drinks "  She gets severely constipated with the zofran - and is not using it because of that.  Jamie Hartman has not taken the [facility] for 2 days with improved intake.  This RN informed her above would be reviewed with covering provider and call returned.

## 2020-06-06 ENCOUNTER — Other Ambulatory Visit: Payer: Self-pay | Admitting: Nurse Practitioner

## 2020-06-06 ENCOUNTER — Telehealth: Payer: Self-pay | Admitting: *Deleted

## 2020-06-06 ENCOUNTER — Other Ambulatory Visit: Payer: Self-pay | Admitting: Oncology

## 2020-06-06 DIAGNOSIS — D649 Anemia, unspecified: Secondary | ICD-10-CM

## 2020-06-06 DIAGNOSIS — C7951 Secondary malignant neoplasm of bone: Secondary | ICD-10-CM

## 2020-06-06 DIAGNOSIS — D696 Thrombocytopenia, unspecified: Secondary | ICD-10-CM

## 2020-06-06 NOTE — Telephone Encounter (Signed)
This RN spoke to pt today for update post holding Ibrance due to vomiting.  See prior note for above data.  Jamie Hartman states the vomiting has resolved and she is feeling improved.  Note pt was completing her last week of Ibrance 75 mg daily.  This RN informed pt to continue off the Ibrance until next week when she has visit with MD and can discuss issues further.  This RN did reassure pt of noted seemed benefit of medication with goal for dose for good tolerance.  No further needs at this time.

## 2020-06-10 ENCOUNTER — Other Ambulatory Visit: Payer: Self-pay | Admitting: *Deleted

## 2020-06-10 ENCOUNTER — Telehealth: Payer: Self-pay

## 2020-06-10 ENCOUNTER — Other Ambulatory Visit: Payer: Self-pay

## 2020-06-10 ENCOUNTER — Encounter: Payer: Self-pay | Admitting: Licensed Clinical Social Worker

## 2020-06-10 ENCOUNTER — Inpatient Hospital Stay: Payer: Medicare HMO

## 2020-06-10 ENCOUNTER — Inpatient Hospital Stay: Payer: Medicare HMO | Attending: Oncology | Admitting: Medical

## 2020-06-10 ENCOUNTER — Ambulatory Visit (HOSPITAL_COMMUNITY)
Admission: RE | Admit: 2020-06-10 | Discharge: 2020-06-10 | Disposition: A | Discharge status: Home / Self Care | Payer: Medicare HMO | Source: Ambulatory Visit | Attending: Medical | Admitting: Medical

## 2020-06-10 ENCOUNTER — Other Ambulatory Visit: Payer: Self-pay | Admitting: Emergency Medicine

## 2020-06-10 VITALS — BP 99/54 | HR 108 | Temp 98.6°F | Resp 20 | Ht 62.0 in

## 2020-06-10 DIAGNOSIS — N951 Menopausal and female climacteric states: Secondary | ICD-10-CM | POA: Insufficient documentation

## 2020-06-10 DIAGNOSIS — C50812 Malignant neoplasm of overlapping sites of left female breast: Secondary | ICD-10-CM | POA: Insufficient documentation

## 2020-06-10 DIAGNOSIS — Z95828 Presence of other vascular implants and grafts: Secondary | ICD-10-CM

## 2020-06-10 DIAGNOSIS — D649 Anemia, unspecified: Secondary | ICD-10-CM | POA: Insufficient documentation

## 2020-06-10 DIAGNOSIS — Z170 Estrogen receptor positive status [ER+]: Secondary | ICD-10-CM | POA: Insufficient documentation

## 2020-06-10 DIAGNOSIS — F17210 Nicotine dependence, cigarettes, uncomplicated: Secondary | ICD-10-CM | POA: Insufficient documentation

## 2020-06-10 DIAGNOSIS — C773 Secondary and unspecified malignant neoplasm of axilla and upper limb lymph nodes: Secondary | ICD-10-CM | POA: Diagnosis present

## 2020-06-10 DIAGNOSIS — T451X5A Adverse effect of antineoplastic and immunosuppressive drugs, initial encounter: Secondary | ICD-10-CM

## 2020-06-10 DIAGNOSIS — D61818 Other pancytopenia: Secondary | ICD-10-CM | POA: Insufficient documentation

## 2020-06-10 DIAGNOSIS — D696 Thrombocytopenia, unspecified: Secondary | ICD-10-CM | POA: Insufficient documentation

## 2020-06-10 DIAGNOSIS — C50912 Malignant neoplasm of unspecified site of left female breast: Secondary | ICD-10-CM

## 2020-06-10 DIAGNOSIS — Z66 Do not resuscitate: Secondary | ICD-10-CM | POA: Diagnosis not present

## 2020-06-10 DIAGNOSIS — D6481 Anemia due to antineoplastic chemotherapy: Secondary | ICD-10-CM

## 2020-06-10 DIAGNOSIS — Z993 Dependence on wheelchair: Secondary | ICD-10-CM | POA: Insufficient documentation

## 2020-06-10 DIAGNOSIS — R5383 Other fatigue: Secondary | ICD-10-CM

## 2020-06-10 DIAGNOSIS — C7931 Secondary malignant neoplasm of brain: Secondary | ICD-10-CM | POA: Diagnosis not present

## 2020-06-10 DIAGNOSIS — Z79811 Long term (current) use of aromatase inhibitors: Secondary | ICD-10-CM

## 2020-06-10 DIAGNOSIS — K219 Gastro-esophageal reflux disease without esophagitis: Secondary | ICD-10-CM

## 2020-06-10 DIAGNOSIS — C7949 Secondary malignant neoplasm of other parts of nervous system: Secondary | ICD-10-CM | POA: Diagnosis not present

## 2020-06-10 DIAGNOSIS — Z9013 Acquired absence of bilateral breasts and nipples: Secondary | ICD-10-CM | POA: Insufficient documentation

## 2020-06-10 DIAGNOSIS — R112 Nausea with vomiting, unspecified: Secondary | ICD-10-CM | POA: Insufficient documentation

## 2020-06-10 DIAGNOSIS — C7951 Secondary malignant neoplasm of bone: Secondary | ICD-10-CM | POA: Insufficient documentation

## 2020-06-10 LAB — CMP (CANCER CENTER ONLY)
ALT: 127 U/L — ABNORMAL HIGH (ref 0–44)
AST: 587 U/L (ref 15–41)
Albumin: 2.2 g/dL — ABNORMAL LOW (ref 3.5–5.0)
Alkaline Phosphatase: 261 U/L — ABNORMAL HIGH (ref 38–126)
Anion gap: 11 (ref 5–15)
BUN: 16 mg/dL (ref 6–20)
CO2: 18 mmol/L — ABNORMAL LOW (ref 22–32)
Calcium: 7.7 mg/dL — ABNORMAL LOW (ref 8.9–10.3)
Chloride: 108 mmol/L (ref 98–111)
Creatinine: 0.93 mg/dL (ref 0.44–1.00)
GFR, Est AFR Am: >60 mL/min (ref >60)
GFR, Estimated: >60 mL/min (ref >60)
Glucose, Bld: 97 mg/dL (ref 70–99)
Potassium: 4.2 mmol/L (ref 3.5–5.1)
Sodium: 137 mmol/L (ref 135–145)
Total Bilirubin: 1.6 mg/dL — ABNORMAL HIGH (ref 0.3–1.2)
Total Protein: 5.3 g/dL — ABNORMAL LOW (ref 6.5–8.1)

## 2020-06-10 LAB — CBC WITH DIFFERENTIAL (CANCER CENTER ONLY)
Abs Immature Granulocytes: 0.12 10*3/uL — ABNORMAL HIGH (ref 0.00–0.07)
Basophils Absolute: 0.1 10*3/uL (ref 0.0–0.1)
Basophils Relative: 1 %
Eosinophils Absolute: 0 10*3/uL (ref 0.0–0.5)
Eosinophils Relative: 0 %
HCT: 27 % — ABNORMAL LOW (ref 36.0–46.0)
Hemoglobin: 8.8 g/dL — ABNORMAL LOW (ref 12.0–15.0)
Immature Granulocytes: 1 %
Lymphocytes Relative: 83 %
Lymphs Abs: 7.7 10*3/uL — ABNORMAL HIGH (ref 0.7–4.0)
MCH: 28.6 pg (ref 26.0–34.0)
MCHC: 32.6 g/dL (ref 30.0–36.0)
MCV: 87.7 fL (ref 80.0–100.0)
Monocytes Absolute: 0.3 10*3/uL (ref 0.1–1.0)
Monocytes Relative: 4 %
Neutro Abs: 1 10*3/uL — ABNORMAL LOW (ref 1.7–7.7)
Neutrophils Relative %: 11 %
Platelet Count: 10 10*3/uL — ABNORMAL LOW (ref 150–400)
RBC: 3.08 MIL/uL — ABNORMAL LOW (ref 3.87–5.11)
RDW: 19 % — ABNORMAL HIGH (ref 11.5–15.5)
WBC Count: 9.2 10*3/uL (ref 4.0–10.5)
nRBC: 3.6 % — ABNORMAL HIGH (ref 0.0–0.2)

## 2020-06-10 LAB — SAMPLE TO BLOOD BANK

## 2020-06-10 LAB — TYPE AND SCREEN
ABO/RH(D): O POS
Antibody Screen: NEGATIVE

## 2020-06-10 LAB — MAGNESIUM: Magnesium: 2.4 mg/dL (ref 1.7–2.4)

## 2020-06-10 MED ORDER — AMOXICILLIN-POT CLAVULANATE 875-125 MG PO TABS: 1.0000 | ORAL_TABLET | Freq: Two times a day (BID) | ORAL | 0 refills | Status: AC

## 2020-06-10 MED ORDER — SODIUM CHLORIDE 0.9% IV SOLUTION
250.0000 mL | Freq: Once | INTRAVENOUS | Status: AC
  Administered 2020-06-10: 250 mL via INTRAVENOUS
  Filled 2020-06-10: qty 250

## 2020-06-10 MED ORDER — SODIUM CHLORIDE 0.9% FLUSH
10.0000 mL | Freq: Once | INTRAVENOUS | Status: AC
  Administered 2020-06-10: 10 mL
  Filled 2020-06-10: qty 10

## 2020-06-10 MED ORDER — ALUM & MAG HYDROXIDE-SIMETH 200-200-20 MG/5ML PO SUSP
ORAL | Status: AC
  Filled 2020-06-10: qty 30

## 2020-06-10 MED ORDER — ALUM & MAG HYDROXIDE-SIMETH 200-200-20 MG/5ML PO SUSP
30.0000 mL | Freq: Once | ORAL | Status: AC
  Administered 2020-06-10: 30 mL via ORAL

## 2020-06-10 MED ORDER — ACETAMINOPHEN 325 MG PO TABS
650.0000 mg | ORAL_TABLET | Freq: Once | ORAL | Status: AC
  Administered 2020-06-10: 650 mg via ORAL

## 2020-06-10 MED ORDER — ACETAMINOPHEN 325 MG PO TABS
ORAL_TABLET | ORAL | Status: AC
  Filled 2020-06-10: qty 2

## 2020-06-10 MED ORDER — HEPARIN SOD (PORK) LOCK FLUSH 100 UNIT/ML IV SOLN
500.0000 [IU] | Freq: Once | INTRAVENOUS | Status: AC
  Administered 2020-06-10: 500 [IU]
  Filled 2020-06-10: qty 5

## 2020-06-10 NOTE — Telephone Encounter (Signed)
Critical Value: AST [id number] Notified: Sandi Mealy, PA-C

## 2020-06-10 NOTE — Patient Instructions (Addendum)
Thrombocytopenia Thrombocytopenia means that you have a low number of platelets in your blood. Platelets are tiny cells in the blood. When you bleed, they clump together at the cut or injury to stop the bleeding. This is called blood clotting. If you do not have enough platelets, it can cause bleeding problems. Some cases of this condition are mild while others are more severe. What are the causes? This condition may be caused by:  Your body not making enough platelets. This may be caused by: ? Your bone marrow not making blood cells (aplastic anemia). ? Cancer in the bone marrow. ? Certain medicines. ? Infection in the bone marrow. ? Drinking a lot of alcohol.  Your body destroying platelets too quickly. This may be caused by: ? Certain immune diseases. ? Certain medicines. ? Certain blood clotting disorders. ? Certain disorders that are passed from parent to child (inherited). ? Certain bleeding disorders. ? Pregnancy. ? Having a spleen that is larger than normal. What are the signs or symptoms?  Bleeding that is not normal.  Nosebleeds.  Heavy menstrual periods.  Blood in the pee (urine) or poop (stool).  A purple-like color to the skin (purpura).  Bruising.  A rash that looks like pinpoint, purple-red spots (petechiae). How is this treated?  Treatment of another condition that is causing the low platelet count.  Medicines to help protect your platelets from being destroyed.  A replacement (transfusion) of platelets to stop or prevent bleeding.  Surgery to remove the spleen. Follow these instructions at home: Activity  Avoid activities that could cause you to get hurt or bruised. Follow instructions about how to prevent falls.  Take care not to cut yourself: ? When you shave. ? When you use scissors, needles, knives, or other tools.  Take care not to burn yourself: ? When you use an iron. ? When you cook. General instructions   Check your skin and the  inside of your mouth for bruises or blood as told by your doctor.  Check to see if there is blood in your spit (sputum), pee, and poop. Do this as told by your doctor.  Do not drink alcohol.  Take over-the-counter and prescription medicines only as told by your doctor.  Do not take any medicines that have aspirin or NSAIDs in them. These medicines can thin your blood and cause you to bleed.  Tell all of your doctors that you have this condition. Be sure to tell your dentist and eye doctor too. Contact a doctor if:  You have bruises and you do not know why. Get help right away if:  You are bleeding anywhere on your body.  You have blood in your spit, pee, or poop. Summary  Thrombocytopenia means that you have a low number of platelets in your blood.  Platelets are needed for blood clotting.  Symptoms of this condition include bleeding that is not normal, and bruising.  Take care not to cut or burn yourself. This information is not intended to replace advice given to you by your health care provider. Make sure you discuss any questions you have with your health care provider. Document Revised: 08/24/2018 Document Reviewed: 08/24/2018 Elsevier Patient Education  2020 Elsevier Inc.  

## 2020-06-10 NOTE — Progress Notes (Signed)
[facility] CSW Progress Note  CSW submitted application for assistance to [facility] in [place]. They will contact patient directly with updates.    Edwinna Areola Jacqueline Delapena , LCSW

## 2020-06-10 NOTE — Progress Notes (Signed)
Critical lab received at 1132: Platelets 10 Hgb 8.8 PA Lucianne Lei made aware.

## 2020-06-11 ENCOUNTER — Encounter: Payer: Self-pay | Admitting: Oncology

## 2020-06-11 ENCOUNTER — Other Ambulatory Visit: Payer: Self-pay

## 2020-06-11 ENCOUNTER — Inpatient Hospital Stay: Payer: Medicare HMO

## 2020-06-11 ENCOUNTER — Telehealth: Payer: Self-pay | Admitting: *Deleted

## 2020-06-11 ENCOUNTER — Other Ambulatory Visit: Payer: Medicare HMO

## 2020-06-11 ENCOUNTER — Other Ambulatory Visit: Payer: Self-pay | Admitting: *Deleted

## 2020-06-11 ENCOUNTER — Inpatient Hospital Stay (HOSPITAL_BASED_OUTPATIENT_CLINIC_OR_DEPARTMENT_OTHER): Payer: Medicare HMO | Admitting: Oncology

## 2020-06-11 ENCOUNTER — Encounter: Payer: Self-pay | Admitting: Licensed Clinical Social Worker

## 2020-06-11 ENCOUNTER — Other Ambulatory Visit: Payer: Self-pay | Admitting: Radiation Therapy

## 2020-06-11 VITALS — BP 93/47 | HR 106 | Temp 98.3°F | Resp 18 | Ht 62.0 in | Wt 119.7 lb

## 2020-06-11 DIAGNOSIS — C7951 Secondary malignant neoplasm of bone: Secondary | ICD-10-CM

## 2020-06-11 DIAGNOSIS — D696 Thrombocytopenia, unspecified: Secondary | ICD-10-CM

## 2020-06-11 DIAGNOSIS — Z79811 Long term (current) use of aromatase inhibitors: Secondary | ICD-10-CM

## 2020-06-11 DIAGNOSIS — C50912 Malignant neoplasm of unspecified site of left female breast: Secondary | ICD-10-CM

## 2020-06-11 DIAGNOSIS — J438 Other emphysema: Secondary | ICD-10-CM

## 2020-06-11 DIAGNOSIS — C50412 Malignant neoplasm of upper-outer quadrant of left female breast: Secondary | ICD-10-CM

## 2020-06-11 DIAGNOSIS — C50812 Malignant neoplasm of overlapping sites of left female breast: Secondary | ICD-10-CM | POA: Diagnosis not present

## 2020-06-11 DIAGNOSIS — C773 Secondary and unspecified malignant neoplasm of axilla and upper limb lymph nodes: Secondary | ICD-10-CM

## 2020-06-11 DIAGNOSIS — D6481 Anemia due to antineoplastic chemotherapy: Secondary | ICD-10-CM

## 2020-06-11 DIAGNOSIS — Z170 Estrogen receptor positive status [ER+]: Secondary | ICD-10-CM

## 2020-06-11 DIAGNOSIS — I700 Atherosclerosis of aorta: Secondary | ICD-10-CM

## 2020-06-11 LAB — PREPARE PLATELET PHERESIS: Unit division: 0

## 2020-06-11 LAB — BPAM PLATELET PHERESIS
Blood Product Expiration Date: 202107092359
ISSUE DATE / TIME: 202107061307
Unit Type and Rh: 7300

## 2020-06-11 MED ORDER — ACETAMINOPHEN 325 MG PO TABS
650.0000 mg | ORAL_TABLET | Freq: Once | ORAL | Status: AC
  Administered 2020-06-11: 650 mg via ORAL

## 2020-06-11 MED ORDER — SODIUM CHLORIDE 0.9% FLUSH
10.0000 mL | INTRAVENOUS | Status: AC | PRN
  Administered 2020-06-11: 10 mL
  Filled 2020-06-11: qty 10

## 2020-06-11 MED ORDER — DIPHENHYDRAMINE HCL 25 MG PO CAPS
ORAL_CAPSULE | ORAL | Status: AC
  Filled 2020-06-11: qty 1

## 2020-06-11 MED ORDER — HEPARIN SOD (PORK) LOCK FLUSH 100 UNIT/ML IV SOLN
500.0000 [IU] | Freq: Every day | INTRAVENOUS | Status: AC | PRN
  Administered 2020-06-11: 500 [IU]
  Filled 2020-06-11: qty 5

## 2020-06-11 MED ORDER — AMINOCAPROIC ACID 500 MG PO TABS: 500.0000 mg | ORAL_TABLET | Freq: Three times a day (TID) | ORAL | 4 refills | Status: AC

## 2020-06-11 MED ORDER — ACETAMINOPHEN 325 MG PO TABS
ORAL_TABLET | ORAL | Status: AC
  Filled 2020-06-11: qty 2

## 2020-06-11 MED ORDER — DIPHENHYDRAMINE HCL 25 MG PO CAPS
25.0000 mg | ORAL_CAPSULE | Freq: Once | ORAL | Status: AC
  Administered 2020-06-11: 25 mg via ORAL

## 2020-06-11 MED ORDER — SODIUM CHLORIDE 0.9% IV SOLUTION
250.0000 mL | Freq: Once | INTRAVENOUS | Status: AC
  Administered 2020-06-11: 250 mL via INTRAVENOUS
  Filled 2020-06-11: qty 250

## 2020-06-11 MED ORDER — AMINOCAPROIC ACID 500 MG PO TABS
500.0000 mg | ORAL_TABLET | Freq: Three times a day (TID) | ORAL | 4 refills | Status: DC
Start: 2020-06-11 — End: 2020-06-11

## 2020-06-11 NOTE — Telephone Encounter (Signed)
Per MD request pt referred to [facility] at [phone].

## 2020-06-11 NOTE — Progress Notes (Signed)
[name]  [phone] [phone]     ID: Jamie Hartman DOB: 06-25-1961  MR#: [id number]  QBH#:[id number]  Patient Care Team: Edmonia James, PA-C as PCP - General (Physician Assistant) Beverely Pace, MD as Referring Physician (Surgical Oncology) Magrinat, Virgie Dad, MD as Consulting Physician (Oncology) Chauncey Cruel, MD OTHER MD:  CHIEF COMPLAINT: Invasive lobular breast cancer, stage IV, estrogen receptor positive; pancytopenia  CURRENT TREATMENT: Fulvestrant; palbociclib; zoledronate   INTERVAL HISTORY: Jamie Hartman returns today for follow up of her triple negative breast cancer accompanied by her husband Jamie Hartman.   Since her last visit, results from her brain MRI on 05/28/2020 returned revealing: multiple foci of enhancement consistent with metastatic disease to the brain; marked surrounding edema with diffuse leptomeningeal enhancement in left parietal and occipital lobe related to metastases; multiple punctate metastases throughout both cerebellar hemispheres; right occipital calvarial metastasis with some expansion, corresponding to uptake on recent PET scan.  She had been receiving fulvestrant, started on 05/01/2020.  She was also taking palbociclib but has been unable to keep that down and has not been on that medication for the last week or so  She also receives zoledronate, most recently on 05/20/2020.  She received 3.5 mg.  She tolerated this with no unusual side effects   REVIEW OF SYSTEMS: Jamie Hartman did not read up on the MRI results in "my chart" and therefore they were not expecting the news they received.  In general she has continued to become very weak.  She has nausea all the time.  She has difficulty keeping things down.  She is taking the Remeron at bedtime and thinks that is helping.  She is bruising easily.  She has not had overt bleeding.  She does not report headaches or visual changes.  Mentally she is very clear.   HISTORY OF  CURRENT ILLNESS: From the original intake note:  Jamie Hartman 's breast cancer history dates back to February 2018 when she noted left nipple retraction.  Her subsequent evaluation and treatment is detailed below, but basically she received neoadjuvant chemotherapy with docetaxel doxorubicin and cyclophosphamide and proceeded to mastectomy which unfortunately showed T3 N2 residual disease, with all 10 axillary lymph nodes removed positive.  She received adjuvant radiation and then was started on anastrozole.  More recently the had a CT scan of the chest with contrast to evaluate her ongoing left-sided chest wall pain.  This showed several new sclerotic lesions in the thoracic vertebrae but no visceral disease.  PET scan 02/18/2020 showed again no visceral uptake but widespread bony metastatic disease through the axial and appendicular skeleton.  Thoracic and lumbar spine MRI confirmed multiple areas of metastatic disease to bone without evidence of neural impingement.  On 03/28/2020 the patient underwent CT-guided biopsy of the right iliac bone, and this showed ([id number] [phone]) metastatic carcinoma consistent with lobular breast cancer, but now triple negative, with an MIB-1 of 20%.  Her subsequent history is as detailed below   PAST MEDICAL HISTORY: Past Medical History:  Diagnosis Date  . Breast cancer ([facility]) 01/2017   metastatic lobular carcinoma    PAST SURGICAL HISTORY: Past Surgical History:  Procedure Laterality Date  . BILATERAL TOTAL MASTECTOMY WITH AXILLARY LYMPH NODE DISSECTION  09/04/2017  . LEEP      FAMILY HISTORY: Family History  Problem Relation Age of Onset  . Emphysema Mother   . Diabetes Father   . Breast cancer Maternal Aunt   . Breast cancer Paternal Aunt   .  Pancreatic cancer Maternal Grandmother   The patient's father died at 16 from complications of diabetes.  He had a sister diagnosed with breast cancer.  The patient's mother died at 34 from complications of  emphysema.  Her mother, the patient's maternal grandmother, had pancreatic cancer and a maternal aunt had a history of breast cancer.  The patient herself has a brother, with no history of cancer.  She has no sisters.   GYNECOLOGIC HISTORY:  No LMP recorded. Patient is postmenopausal. Menarche: 59 years old GX P 0 LMP 50 HRT no  Hysterectomy?  No BSO?  No   SOCIAL HISTORY: (updated May 2021)  Jamie Hartman lives with her husband Jamie Hartman and 4 cats.  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    ADVANCED DIRECTIVES: In the absence of any document to the contrary the patient's husband is her healthcare power of attorney.   HEALTH MAINTENANCE: Social History   Tobacco Use  . Smoking status: Current Every Day Smoker    Packs/day: 0.50    Types: Cigarettes  . Smokeless tobacco: Never Used  Substance Use Topics  . Alcohol use: Yes  . Drug use: Not on file     Colonoscopy: 2020  PAP: Up-to-date  Bone density: 2019   Allergies  Allergen Reactions  . Pantoprazole Nausea Only, Other (See Comments) and Shortness Of Breath    Leg cramps, headaches Leg cramps, headaches Leg cramps, headaches Leg cramps, headaches   . Adhesive  [Tape] Dermatitis    Band aids  . Avelox [Moxifloxacin Hcl In Nacl]     Severe headache  . Ciprofloxacin Rash    Current Outpatient Medications  Medication Sig Dispense Refill  . aminocaproic acid (AMICAR) 500 MG tablet Take 1 tablet (500 mg total) by mouth every 8 (eight) hours. 90 tablet 4  . amoxicillin-clavulanate (AUGMENTIN) 875-125 MG tablet Take 1 tablet by mouth 2 (two) times daily. 14 tablet 0  . cholecalciferol (VITAMIN D3) 25 MCG (1000 UNIT) tablet Take 1 tablet (1,000 Units total) by mouth daily.    . hydrOXYzine (ATARAX/VISTARIL) 25 MG tablet TAKE 1 TABLET (25 MG TOTAL) BY MOUTH AT BEDTIME AND MAY REPEAT DOSE ONE TIME IF NEEDED. 180 tablet 1  . mirtazapine (REMERON) 7.5 MG tablet Take 1 tablet (7.5 mg total) by mouth at bedtime. 30 tablet 1  .  ondansetron (ZOFRAN-ODT) 8 MG disintegrating tablet Take by mouth.    . palbociclib (IBRANCE) 75 MG tablet Take 1 tablet (75 mg total) by mouth daily. Take for 21 days on, 7 days off, repeat every 28 days. 21 tablet 6  . prochlorperazine (COMPAZINE) 10 MG tablet Take 0.5 tablets (5 mg total) by mouth every 6 (six) hours as needed for nausea or vomiting. 30 tablet   . promethazine (PHENERGAN) 25 MG suppository Place 1 suppository (25 mg total) rectally every 6 (six) hours as needed for nausea or vomiting. 12 each 3  . valACYclovir (VALTREX) 500 MG tablet 1 tablet bid x 3 days    . Zoledronic Acid (ZOMETA) 4 MG/100ML IVPB Inject into the vein.     No current facility-administered medications for this visit.    OBJECTIVE: White woman examined in a wheelchair  Vitals:   06/11/20 1115  BP: (!) 93/47  Pulse: (!) 106  Resp: 18  Temp: 98.3 F (36.8 C)  SpO2: 98%     Body mass index is 21.89 kg/m.   Wt Readings from Last 3 Encounters:  06/11/20 119 lb 11.2 oz (54.3 kg)  05/22/20 125  lb 4.8 oz (56.8 kg)  05/15/20 120 lb 14.4 oz (54.8 kg)  For vitals associated with the 05/29/2028 visit please see the infusion area flowsheet   ECOG FS:2 - Symptomatic, <50% confined to bed  Sclerae unicteric, pupils equal and EOMs intact Wearing a mask No cervical or supraclavicular adenopathy Lungs no rales or rhonchi Heart regular rate and rhythm Abd soft, nontender, MSK no focal spinal tenderness Neuro: nonfocal, well oriented, appropriate affect Breasts: Deferred   LAB RESULTS:  CMP     Component Value Date/Time   NA 137 06/10/2020 1110   K 4.2 06/10/2020 1110   CL 108 06/10/2020 1110   CO2 18 (L) 06/10/2020 1110   GLUCOSE 97 06/10/2020 1110   BUN 16 06/10/2020 1110   CREATININE 0.93 06/10/2020 1110   CALCIUM 7.7 (L) 06/10/2020 1110   PROT 5.3 (L) 06/10/2020 1110   ALBUMIN 2.2 (L) 06/10/2020 1110   AST 587 (HH) 06/10/2020 1110   ALT 127 (H) 06/10/2020 1110   ALKPHOS 261 (H) 06/10/2020  1110   BILITOT 1.6 (H) 06/10/2020 1110   GFRNONAA >60 06/10/2020 1110   GFRAA >60 06/10/2020 1110    No results found for: TOTALPROTELP, ALBUMINELP, A1GS, A2GS, BETS, BETA2SER, GAMS, MSPIKE, SPEI  Lab Results  Component Value Date   WBC 9.2 06/10/2020   NEUTROABS 1.0 (L) 06/10/2020   HGB 8.8 (L) 06/10/2020   HCT 27.0 (L) 06/10/2020   MCV 87.7 06/10/2020   PLT 10 (L) 06/10/2020    No results found for: LABCA2  No components found for: [id number]  No results for input(s): INR in the last 168 hours.  No results found for: LABCA2  No results found for: IRS854  No results found for: OEV035  Lab Results  Component Value Date   KKX381 14,683.0 (H) 05/20/2020    No results found for: CA2729  No components found for: HGQUANT  No results found for: CEA1 / No results found for: CEA1   No results found for: AFPTUMOR  No results found for: CHROMOGRNA  No results found for: KPAFRELGTCHN, LAMBDASER, KAPLAMBRATIO (kappa/lambda light chains)  No results found for: HGBA, HGBA2QUANT, HGBFQUANT, HGBSQUAN (Hemoglobinopathy evaluation)   No results found for: LDH  No results found for: IRON, TIBC, IRONPCTSAT (Iron and TIBC)  No results found for: FERRITIN  Urinalysis    Component Value Date/Time   BILIRUBINUR negative 03/04/2016 1453   KETONESUR trace (5) (A) 03/04/2016 1453   PROTEINUR negative 03/04/2016 1453   UROBILINOGEN 0.2 03/04/2016 1453   NITRITE Negative 03/04/2016 1453   LEUKOCYTESUR Negative 03/04/2016 1453    STUDIES: DG Chest 2 View  Result Date: 06/10/2020 CLINICAL DATA:  History of metastatic breast cancer with decreased breath sounds over the left lower lung. EXAM: CHEST - 2 VIEW COMPARISON:  Apr 08, 2017 FINDINGS: There is stable right-sided venous Port-A-Cath positioning. Moderate severity atelectasis and/or infiltrate is seen within the left lung base. There is a small left pleural effusion. No pneumothorax is identified. The heart size and  mediastinal contours are within normal limits. The visualized skeletal structures are unremarkable. IMPRESSION: 1. Moderate severity left basilar atelectasis and/or infiltrate. 2. Small left pleural effusion. 3. Stable right-sided venous Port-A-Cath positioning. Electronically Signed   By: Virgina Norfolk M.D.   On: 06/10/2020 15:02   MR Brain W Wo Contrast  Result Date: 05/30/2020 CLINICAL DATA:  Breast cancer. PET scan demonstrates focal uptake in the left periventricular white matter EXAM: MRI HEAD WITHOUT AND WITH CONTRAST TECHNIQUE: Multiplanar, multiecho pulse  sequences of the brain and surrounding structures were obtained without and with intravenous contrast. CONTRAST:  82m GADAVIST GADOBUTROL 1 MMOL/ML IV SOLN COMPARISON:  PET scan 04/28/2020. FINDINGS: Brain: Multiple foci of enhancement are present, consistent with metastatic disease. A focal lesion within the posterosuperior left occipital lobe measures 10 x 6 x 11 mm on image 106 of series 16. In adjacent lesion measures 6 mm. Marked surrounding edema is present with diffuse leptomeningeal enhancement. Smaller cortical based lesions in the left parietal lobe on image 108 measure 4 mm, anterior left parietal lobe on image 115 measure 1-2 mm and multiple subcentimeter lesions in the left occipital lobe. Posterior right parietal solid enhancing lesion measures 7 x 9 x 6 mm. 5 mm right occipital lobe lesion is noted on image 81 2 mm more posterior right occipital lesion is present on image 82. Multiple punctate foci of enhancement are present throughout both cerebellar hemispheres. A 5 mm lesion in the inferior cerebellar vermis is the largest lesion. Over 20 punctate lesions are present. Left a meningeal enhancement is present along the superior vermis. No significant anterior lesions are present. Edema is most profound in the left parietal and occipital lobe. There is some edema in the posterior right parietal lobe as well. Minimal T2 signal is  associated with the scattered cerebellar lesions, most noted at the superior vermis. No acute hemorrhage is present. No acute infarct is present. Mild periventricular T2 signal changes are otherwise within normal limits for age. The ventricles are of normal size. No significant extraaxial fluid collection is present. Vascular: Flow is present in the major intracranial arteries. Skull and upper cervical spine: Enhancement in the right occipital calvarium with some expansion is compatible with focal metastasis. Other smaller areas of posterior parietal skull enhancement are noted. This corresponds with the uptake on PET scan. Sinuses/Orbits: The paranasal sinuses and mastoid air cells are clear. The globes and orbits are within normal limits. IMPRESSION: 1. Multiple foci of enhancement consistent with metastatic disease to the brain. 2. Marked surrounding edema with diffuse leptomeningeal enhancement in the left parietal and occipital lobe related to both parenchymal and leptomeningeal metastases. 3. Multiple punctate metastases throughout both cerebellar hemispheres. The largest lesion is in the inferior vermis. Left a meningeal disease is present along the superior vermis. 4. Right occipital calvarial metastasis with some expansion. This corresponds with the uptake on PET scan. Electronically Signed   By: CSan MorelleM.D.   On: 05/30/2020 05:00   MR LIVER W WO CONTRAST  Result Date: 05/19/2020 CLINICAL DATA:  Lobular breast cancer. Elevated liver function tests. Evaluate for liver metastasis. EXAM: MRI ABDOMEN WITHOUT AND WITH CONTRAST TECHNIQUE: Multiplanar multisequence MR imaging of the abdomen was performed both before and after the administration of intravenous contrast. CONTRAST:  565mGADAVIST GADOBUTROL 1 MMOL/ML IV SOLN COMPARISON:  [facility] PET 02/18/2020. [facility] 03/17/2017 abdominopelvic CT. FINDINGS: Portions of exam are mildly motion degraded. Lower chest: Moderate  left and small right pleural effusions. Normal heart size. Bilateral breast implants. Hepatobiliary: High left hepatic lobe 8 mm cyst, present back 03/17/2017. No suspicious liver lesion. No significant stenosis. Normal gallbladder, without biliary ductal dilatation. Pancreas:  Normal, without mass or ductal dilatation. Spleen:  Borderline splenomegaly, 12.9 cm craniocaudal. Adrenals/Urinary Tract: Normal adrenal glands. Normal kidneys, without hydronephrosis. Stomach/Bowel: Normal stomach and abdominal bowel loops. Vascular/Lymphatic: Aortic atherosclerosis. No retroperitoneal or retrocrural adenopathy. Other:  No ascites. Musculoskeletal: Relatively diffuse osseous metastasis, as evidenced by heterogeneous enhancement throughout. IMPRESSION: 1.  Mildly motion degraded exam. 2. Given this limitation, no evidence of hepatic metastasis or explanation for elevated liver function tests. 3. Widespread osseous metastasis. 4. Left larger than right pleural effusions. 5.  Aortic Atherosclerosis (ICD10-I70.0). Electronically Signed   By: Abigail Miyamoto M.D.   On: 05/19/2020 14:09    ELIGIBLE FOR AVAILABLE RESEARCH PROTOCOL: To be determined  ASSESSMENT: 59 y.o. [place] woman with metastatic lobular breast cancer as follows:  (1) status post left breast overlapping and axillary lymph node biopsy 01/26/2017 for a clinical mT2 N1, stage IIA invasive lobular carcinoma, strongly estrogen and progesterone receptor positive, HER-2 not amplified  (2) treated neoadjuvantly with docetaxel, doxorubicin and cyclophosphamide for 6 cycles, between 03/10/2017 and 07/01/2017  (3) status post bilateral mastectomies 08/25/2017 showing residual invasive lobular carcinoma, ypT3 ypN3, stage IIIB  (4) status post adjuvant radiation to the left breast and axilla 11/30/2017 through 01/10/2018.  (5) anastrozole started 09/22/2017  METASTATIC DISEASE (6) CT scan of the chest 02/11/2020, PET scan 02/18/2020 and MRI of the  thoracolumbar spine 03/12/2020 show multiple bone lesions (axial and appendicular) without neural impingement and without evidence of visceral disease  (a) CT-guided right iliac crest biopsy 03/28/2020 confirms metastatic invasive lobular breast cancer, now triple negative, with an MIB-1 of 20%  (b) F-18 estradiol "cerinanna" PET scan 04/28/2020 shows stron estrogen avidity in bone lesions, questionable brain lesion, does not resolve issue of possible liver lesions  (c) liver MRI 05/19/2020 shows no evidence of metastatic disease to the liver.  (b) brain MRI 05/28/2020 shows multiple foci consistent with metastatic disease, with surrounding edema, diffuse leptomeningeal enhancement right occipital calvarial layer metastasis  (7) palbociclib and fulvestrant started 05/01/2020  (b) palbociclib held after 2 weeks because of pancytopenia, resumed 125 mg every other day  (8) zoledronate started 05/20/2020 3.5 mg, to be repeated every 12 weeks  (9) pancytopenia, most likely secondary to bone marrow infiltration by tumor  (a) Amicar 500 mg p.o. 3 times daily started 06/11/2020  (10) MRI of the brain 05/30/2020 shows leptomeningeal disease in addition to brain lesions.  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 This is a terminal event and I explained to them that I do not have really good treatment for this.  I have discussed her situation with Dr. Lisbeth Renshaw and radiation oncology. We think a brief course of whole brain radiation, perhaps 5 days, may decrease the risk of seizures and strokes. This should start tomorrow 06/12/2020 and continue through 07/02/2020.  We have contacted hospice of the [place]. Jamie Hartman tells me his brother died within the last 2 months and he was served  by hospice so he has a little bit of experience with them. They likely will not pick up the case until the radiation is completed.  She will receive some platelets today. After this she will be on Amicar 500 mg 3 times daily to lower the risk of bleeding.  I have written an out of facility DNR order and given it to Jamie Hartman to keep at home so that unnecessary CPR and other procedures can be avoided.  The understands her prognosis is likely to be brief and that if things become so complicated she cannot be cared for at home she could be in a hospice 24-hour facility. Her wish however of course is to  stay at home if at all possible.  I will see them on July 14 after her last radiation treatment and we will have virtual visits after that.  Total encounter time 35 minutes.Sarajane Jews C. Linsey Arteaga, MD 06/11/2020 11:42 AM Medical Oncology and Hematology [facility] [place], [place] [address] Tel. [phone]    Fax. [phone]   This document serves as a record of services personally performed by Lurline Del, MD. It was created on his behalf by Wilburn Mylar, a trained medical scribe. The creation of this record is based on the scribe's personal observations and the provider's statements to them.   I, Lurline Del MD, have reviewed the above documentation for accuracy and completeness, and I agree with the above.   *Total Encounter Time as defined by the Centers for Medicare and Medicaid Services includes, in addition to the face-to-face time of a patient visit (documented in the note above) non-face-to-face time: obtaining and reviewing outside history, ordering and reviewing medications, tests or procedures, care coordination (communications with other health care professionals or caregivers) and documentation in the medical record.

## 2020-06-11 NOTE — Progress Notes (Signed)
Pt is approved for the $1000 [employer] grant.  

## 2020-06-11 NOTE — Progress Notes (Signed)
[facility] CSW Progress Note  [employer] representative met with patient and husband to follow-up on financial resources. Husband, Wille Glaser, had a question about assistance they received and if they can continue to receive help in light of new prognosis (see note by Dr. [name]). CSW will continue to look for additional assistance. [place] cards today and family also met with financial advocate for [employer].     Edwinna Areola Dyamond Tolosa , LCSW

## 2020-06-11 NOTE — Patient Instructions (Signed)

## 2020-06-12 ENCOUNTER — Encounter: Payer: Self-pay | Admitting: Radiation Oncology

## 2020-06-12 ENCOUNTER — Ambulatory Visit
Admission: RE | Admit: 2020-06-12 | Discharge: 2020-06-12 | Disposition: A | Discharge status: Home / Self Care | Payer: Medicare HMO | Source: Ambulatory Visit | Attending: Radiation Oncology | Admitting: Radiation Oncology

## 2020-06-12 ENCOUNTER — Ambulatory Visit: Payer: Medicare HMO

## 2020-06-12 DIAGNOSIS — C7931 Secondary malignant neoplasm of brain: Secondary | ICD-10-CM

## 2020-06-12 LAB — PREPARE PLATELET PHERESIS: Unit division: 0

## 2020-06-12 LAB — BPAM PLATELET PHERESIS
Blood Product Expiration Date: 202107092359
ISSUE DATE / TIME: 202107071242
Unit Type and Rh: 7300

## 2020-06-12 MED FILL — AMICAR 500 MG TABLET: 500 | 30 days supply | Qty: 90 | Fill #0

## 2020-06-12 NOTE — Progress Notes (Signed)
[facility] Progress Note   Jamie Hartman [id number] 01/23/1961 59 y.o.  Jamie Hartman is managed by Dr. Lurline Del  Actively treated with chemotherapy/immunotherapy/hormonal therapy: yes  Current therapy: Ibrance  Next scheduled appointment with provider: To be arranged  Assessment: Plan:    Bone metastases ([place]) - Plan: heparin lock flush 100 unit/mL, sodium chloride flush (NS) 0.9 % injection 10 mL, DG Chest 2 View  Use of anastrozole - Plan: heparin lock flush 100 unit/mL, sodium chloride flush (NS) 0.9 % injection 10 mL  Malignant neoplasm of overlapping sites of left breast in female, estrogen receptor positive ([name]) - Plan: heparin lock flush 100 unit/mL, sodium chloride flush (NS) 0.9 % injection 10 mL, DG Chest 2 View  Port-A-Cath in place - Plan: heparin lock flush 100 unit/mL, sodium chloride flush (NS) 0.9 % injection 10 mL  Thrombocytopenia ([facility]) - Plan: Informed Consent Details: Physician/Practitioner Attestation; Transcribe to consent form and obtain patient signature, Type and screen, Care order/instruction, 0.9 %  sodium chloride infusion (Manually program via Guardrails IV Fluids), Prepare platelet pheresis, Transfuse platelet pheresis, acetaminophen (TYLENOL) tablet 650 mg, Type and screen, Prepare platelet pheresis, Care order/instruction, Informed Consent Details: Physician/Practitioner Attestation; Transcribe to consent form and obtain patient signature, Transfuse platelet pheresis, CANCELED: Sample to Blood Bank  Breast cancer metastasized to axillary lymph node, left ([place]) - Plan: DG Chest 2 View, CANCELED: Sample to Blood Bank  Anemia due to antineoplastic chemotherapy - Plan: CANCELED: Sample to Blood Bank  Gastroesophageal reflux disease, unspecified whether esophagitis present - Plan: alum & mag hydroxide-simeth (MAALOX/MYLANTA) 200-200-20 MG/5ML suspension 30 mL   ER positive metastatic malignant neoplasm of the left breast with  bone metastasis: The patient continues to be managed by Dr. [name] and is currently treated with Ibrance.  She is told to continue holding Ibrance due to her nausea.  Dr. [name] will contact the patient and will discuss options.  She was referred for chest x-ray given decreased left lower lobe lung sounds.  Thrombocytopenia: A CBC returned today showing a platelet count of 10.  She was given 1 unit of platelets today.  Anemia: His CBC returned showing a hemoglobin of 8.8 and a hematocrit of 27.  No packed red blood cells are indicated today.  GERD: The patient was given Mylanta 30 mils p.o. x1  Please see After Visit Summary for patient specific instructions.  Future Appointments  Date Time Provider [place]  06/12/2020  2:30 PM Kyung Rudd, MD [facility] None  06/13/2020 10:30 AM Kyung Rudd, MD [facility] None  06/26/2020  2:00 PM [facility] None  06/26/2020  2:30 PM [place] [facility] None  07/24/2020  2:00 PM [facility] None  07/24/2020  2:30 PM [place] [facility] None  08/21/2020  2:00 PM [facility] None  08/21/2020  2:30 PM [place] [facility] None  09/18/2020  2:00 PM [facility] None  09/18/2020  2:30 PM [place] [facility] None  10/16/2020  2:00 PM [facility] None  10/16/2020  2:30 PM [place] [facility] None  11/13/2020  2:00 PM [facility] None  11/13/2020  2:30 PM [facility] [place] [facility] None    Orders Placed This Encounter  Procedures   DG Chest 2 View   Informed Consent Details: Physician/Practitioner Attestation; Transcribe to consent form and obtain patient signature   Care order/instruction   Type and screen  Prepare platelet pheresis       Subjective:   Patient ID:  Jamie Hartman is a 59 y.o. (DOB Jul 04, 1961) female.  Chief Complaint:  Chief Complaint  Patient presents with    Fatigue   Shortness of Breath    HPI Jamie Hartman  is a 59 y.o. female with a diagnosis of an ER positive metastatic malignant neoplasm of the left breast with bone metastasis.  She continues to be managed by Dr. [name] and is currently treated with Ibrance.  She presents to the clinic today with weakness, fatigue, shortness of breath, dyspnea on exertion, increased bruising since last week with worsening towards the end of the week.  She continues to have hot flashes.  On 05/29/2020 she was noted to have a platelet count of 22 and was given IV platelets.  She denies any issues of acute bleeding.  She reports nausea and vomiting with each dose of Ibrance that she takes.  Medications: I have reviewed the patient's current medications.  Allergies:  Allergies  Allergen Reactions   Pantoprazole Nausea Only, Other (See Comments) and Shortness Of Breath    Leg cramps, headaches Leg cramps, headaches Leg cramps, headaches Leg cramps, headaches    Adhesive  [Tape] Dermatitis    Band aids   Avelox [Moxifloxacin Hcl In Nacl]     Severe headache   Ciprofloxacin Rash    Past Medical History:  Diagnosis Date   Breast cancer ([place]) 01/2017   metastatic lobular carcinoma    Past Surgical History:  Procedure Laterality Date   BILATERAL TOTAL MASTECTOMY WITH AXILLARY LYMPH NODE DISSECTION  09/04/2017   LEEP      Family History  Problem Relation Age of Onset   Emphysema Mother    Diabetes Father    Breast cancer Maternal Aunt    Breast cancer Paternal Aunt    Pancreatic cancer Maternal Grandmother     Social History   Socioeconomic History   Marital status: Married    Spouse name: Not on file   Number of children: Not on file   Years of education: Not on file   Highest education level: Not on file  Occupational History   Not on file  Tobacco Use   Smoking status: Current Every Day Smoker    Packs/day: 0.50    Types: Cigarettes   Smokeless tobacco:  Never Used  Substance and Sexual Activity   Alcohol use: Yes   Drug use: Not on file   Sexual activity: Not on file  Other Topics Concern   Not on file  Social History Narrative   Not on file   Social Determinants of Health   Financial Resource Strain:    Difficulty of Paying Living Expenses:   Food Insecurity:    Worried About Charity fundraiser in the Last Year:    Arboriculturist in the Last Year:   Transportation Needs:    Film/video editor (Medical):    Lack of Transportation (Non-Medical):   Physical Activity:    Days of Exercise per Week:    Minutes of Exercise per Session:   Stress:    Feeling of Stress :   Social Connections:    Frequency of Communication with Friends and Family:    Frequency of Social Gatherings with Friends and Family:    Attends Religious Services:    Active Member of Clubs or Organizations:    Attends Archivist Meetings:    Marital Status:  Intimate Partner Violence:    Fear of Current or Ex-Partner:    Emotionally Abused:    Physically Abused:    Sexually Abused:     Past Medical History, Surgical history, Social history, and Family history were reviewed and updated as appropriate.   Please see review of systems for further details on the patient's review from today.   Review of Systems:  Review of Systems  Constitutional: Positive for fatigue. Negative for chills, diaphoresis and fever.  HENT: Negative for trouble swallowing.   Respiratory: Positive for shortness of breath. Negative for cough, choking, chest tightness, wheezing and stridor.   Cardiovascular: Negative for chest pain and palpitations.  Gastrointestinal: Positive for nausea and vomiting. Negative for anal bleeding, blood in stool, constipation and diarrhea.  Skin: Positive for pallor.  Neurological: Positive for weakness.  Hematological: Bruises/bleeds easily.    Objective:   Physical Exam:  BP (!) 99/54    Pulse (!) 108     Temp 98.6 F (37 C) (Oral)    Resp 20    Ht 5\' 2"  (1.575 m)    SpO2 98%    BMI 22.92 kg/m  ECOG: 2  Physical Exam Constitutional:      General: She is not in acute distress.    Appearance: She is not diaphoretic.  HENT:     Head: Normocephalic and atraumatic.  Eyes:     Comments: Conjunctiva is pale  Cardiovascular:     Rate and Rhythm: Regular rhythm. Tachycardia present.     Heart sounds: Normal heart sounds. No murmur heard.  No friction rub. No gallop.   Pulmonary:     Effort: Pulmonary effort is normal. No respiratory distress.     Breath sounds: Normal breath sounds. No stridor. No wheezing or rales.  Skin:    General: Skin is warm and dry.     Coloration: Skin is pale.     Findings: Ecchymosis present. No erythema.  Neurological:     Mental Status: She is alert.  Psychiatric:        Behavior: Behavior normal.        Thought Content: Thought content normal.        Judgment: Judgment normal.     Lab Review:     Component Value Date/Time   NA 137 06/10/2020 1110   K 4.2 06/10/2020 1110   CL 108 06/10/2020 1110   CO2 18 (L) 06/10/2020 1110   GLUCOSE 97 06/10/2020 1110   BUN 16 06/10/2020 1110   CREATININE 0.93 06/10/2020 1110   CALCIUM 7.7 (L) 06/10/2020 1110   PROT 5.3 (L) 06/10/2020 1110   ALBUMIN 2.2 (L) 06/10/2020 1110   AST 587 (HH) 06/10/2020 1110   ALT 127 (H) 06/10/2020 1110   ALKPHOS 261 (H) 06/10/2020 1110   BILITOT 1.6 (H) 06/10/2020 1110   GFRNONAA >60 06/10/2020 1110   GFRAA >60 06/10/2020 1110       Component Value Date/Time   WBC 9.2 06/10/2020 1110   WBC 8.9 05/26/2020 1341   RBC 3.08 (L) 06/10/2020 1110   HGB 8.8 (L) 06/10/2020 1110   HCT 27.0 (L) 06/10/2020 1110   PLT 10 (L) 06/10/2020 1110   MCV 87.7 06/10/2020 1110   MCH 28.6 06/10/2020 1110   MCHC 32.6 06/10/2020 1110   RDW 19.0 (H) 06/10/2020 1110   LYMPHSABS 7.7 (H) 06/10/2020 1110   MONOABS 0.3 06/10/2020 1110   EOSABS 0.0 06/10/2020 1110   BASOSABS 0.1 06/10/2020 1110    -------------------------------  Imaging  from last 24 hours (if applicable):  Radiology interpretation: DG Chest 2 View  Result Date: 06/10/2020 CLINICAL DATA:  History of metastatic breast cancer with decreased breath sounds over the left lower lung. EXAM: CHEST - 2 VIEW COMPARISON:  Apr 08, 2017 FINDINGS: There is stable right-sided venous Port-A-Cath positioning. Moderate severity atelectasis and/or infiltrate is seen within the left lung base. There is a small left pleural effusion. No pneumothorax is identified. The heart size and mediastinal contours are within normal limits. The visualized skeletal structures are unremarkable. IMPRESSION: 1. Moderate severity left basilar atelectasis and/or infiltrate. 2. Small left pleural effusion. 3. Stable right-sided venous Port-A-Cath positioning. Electronically Signed   By: Virgina Norfolk M.D.   On: 06/10/2020 15:02   MR Brain W Wo Contrast  Result Date: 05/30/2020 CLINICAL DATA:  Breast cancer. PET scan demonstrates focal uptake in the left periventricular white matter EXAM: MRI HEAD WITHOUT AND WITH CONTRAST TECHNIQUE: Multiplanar, multiecho pulse sequences of the brain and surrounding structures were obtained without and with intravenous contrast. CONTRAST:  38mL GADAVIST GADOBUTROL 1 MMOL/ML IV SOLN COMPARISON:  PET scan 04/28/2020. FINDINGS: Brain: Multiple foci of enhancement are present, consistent with metastatic disease. A focal lesion within the posterosuperior left occipital lobe measures 10 x 6 x 11 mm on image 106 of series 16. In adjacent lesion measures 6 mm. Marked surrounding edema is present with diffuse leptomeningeal enhancement. Smaller cortical based lesions in the left parietal lobe on image 108 measure 4 mm, anterior left parietal lobe on image 115 measure 1-2 mm and multiple subcentimeter lesions in the left occipital lobe. Posterior right parietal solid enhancing lesion measures 7 x 9 x 6 mm. 5 mm right occipital lobe lesion is  noted on image 81 2 mm more posterior right occipital lesion is present on image 82. Multiple punctate foci of enhancement are present throughout both cerebellar hemispheres. A 5 mm lesion in the inferior cerebellar vermis is the largest lesion. Over 20 punctate lesions are present. Left a meningeal enhancement is present along the superior vermis. No significant anterior lesions are present. Edema is most profound in the left parietal and occipital lobe. There is some edema in the posterior right parietal lobe as well. Minimal T2 signal is associated with the scattered cerebellar lesions, most noted at the superior vermis. No acute hemorrhage is present. No acute infarct is present. Mild periventricular T2 signal changes are otherwise within normal limits for age. The ventricles are of normal size. No significant extraaxial fluid collection is present. Vascular: Flow is present in the major intracranial arteries. Skull and upper cervical spine: Enhancement in the right occipital calvarium with some expansion is compatible with focal metastasis. Other smaller areas of posterior parietal skull enhancement are noted. This corresponds with the uptake on PET scan. Sinuses/Orbits: The paranasal sinuses and mastoid air cells are clear. The globes and orbits are within normal limits. IMPRESSION: 1. Multiple foci of enhancement consistent with metastatic disease to the brain. 2. Marked surrounding edema with diffuse leptomeningeal enhancement in the left parietal and occipital lobe related to both parenchymal and leptomeningeal metastases. 3. Multiple punctate metastases throughout both cerebellar hemispheres. The largest lesion is in the inferior vermis. Left a meningeal disease is present along the superior vermis. 4. Right occipital calvarial metastasis with some expansion. This corresponds with the uptake on PET scan. Electronically Signed   By: San Morelle M.D.   On: 05/30/2020 05:00   MR LIVER W WO  CONTRAST  Result Date: 05/19/2020 CLINICAL DATA:  Lobular breast cancer. Elevated liver function tests. Evaluate for liver metastasis. EXAM: MRI ABDOMEN WITHOUT AND WITH CONTRAST TECHNIQUE: Multiplanar multisequence MR imaging of the abdomen was performed both before and after the administration of intravenous contrast. CONTRAST:  39mL GADAVIST GADOBUTROL 1 MMOL/ML IV SOLN COMPARISON:  [facility] PET 02/18/2020. [facility] 03/17/2017 abdominopelvic CT. FINDINGS: Portions of exam are mildly motion degraded. Lower chest: Moderate left and small right pleural effusions. Normal heart size. Bilateral breast implants. Hepatobiliary: High left hepatic lobe 8 mm cyst, present back 03/17/2017. No suspicious liver lesion. No significant stenosis. Normal gallbladder, without biliary ductal dilatation. Pancreas:  Normal, without mass or ductal dilatation. Spleen:  Borderline splenomegaly, 12.9 cm craniocaudal. Adrenals/Urinary Tract: Normal adrenal glands. Normal kidneys, without hydronephrosis. Stomach/Bowel: Normal stomach and abdominal bowel loops. Vascular/Lymphatic: Aortic atherosclerosis. No retroperitoneal or retrocrural adenopathy. Other:  No ascites. Musculoskeletal: Relatively diffuse osseous metastasis, as evidenced by heterogeneous enhancement throughout. IMPRESSION: 1. Mildly motion degraded exam. 2. Given this limitation, no evidence of hepatic metastasis or explanation for elevated liver function tests. 3. Widespread osseous metastasis. 4. Left larger than right pleural effusions. 5.  Aortic Atherosclerosis (ICD10-I70.0). Electronically Signed   By: Abigail Miyamoto M.D.   On: 05/19/2020 14:09

## 2020-06-13 ENCOUNTER — Other Ambulatory Visit: Payer: Self-pay

## 2020-06-13 ENCOUNTER — Telehealth: Payer: Self-pay | Admitting: Oncology

## 2020-06-13 ENCOUNTER — Ambulatory Visit
Admission: RE | Admit: 2020-06-13 | Discharge: 2020-06-13 | Disposition: A | Discharge status: Home / Self Care | Payer: Medicare HMO | Source: Ambulatory Visit | Attending: Radiation Oncology | Admitting: Radiation Oncology

## 2020-06-13 DIAGNOSIS — C50812 Malignant neoplasm of overlapping sites of left female breast: Secondary | ICD-10-CM | POA: Insufficient documentation

## 2020-06-13 DIAGNOSIS — C7932 Secondary malignant neoplasm of cerebral meninges: Secondary | ICD-10-CM | POA: Insufficient documentation

## 2020-06-13 DIAGNOSIS — C7951 Secondary malignant neoplasm of bone: Secondary | ICD-10-CM | POA: Diagnosis not present

## 2020-06-13 DIAGNOSIS — Z510 Encounter for antineoplastic radiation therapy: Secondary | ICD-10-CM | POA: Diagnosis not present

## 2020-06-13 DIAGNOSIS — C773 Secondary and unspecified malignant neoplasm of axilla and upper limb lymph nodes: Secondary | ICD-10-CM | POA: Diagnosis not present

## 2020-06-13 NOTE — Telephone Encounter (Signed)
No 7/7 los. No changes made to pt's schedule.  

## 2020-06-15 DIAGNOSIS — C7931 Secondary malignant neoplasm of brain: Secondary | ICD-10-CM | POA: Insufficient documentation

## 2020-06-15 NOTE — Progress Notes (Signed)
Radiation Oncology         ([phone]) [phone] ________________________________  Name: Jamie Hartman MRN: [id number]  Date: 06/12/2020  DOB: [birth year]  PY:[id number], Matt, PA-C  Magrinat, Virgie Dad, MD     REFERRING PHYSICIAN: Magrinat, Virgie Dad, MD   DIAGNOSIS: The encounter diagnosis was Brain metastasis [facility]).   HISTORY OF PRESENT ILLNESS::Jamie Hartman is a 59 y.o. female who is seen for an initial consultation visit regarding the patient's diagnosis of stage IV breast cancer with brain metastasis.  The patient has a diagnosis of stage IV breast cancer and underwent a recent brain MRI scan.  This showed multiple brain metastases in addition to significant leptomeningeal disease.  The patient has discussed these recent findings with medical oncology and they have made plans to proceed with hospice in the fairly near future.  Given the findings of brain disease, I have been asked to see the patient for consideration of possible radiation treatment to the brain to help primarily improve quality of life given these findings.    PREVIOUS RADIATION THERAPY: No   PAST MEDICAL HISTORY:  has a past medical history of Breast cancer ([place]) (01/2017).     PAST SURGICAL HISTORY: Past Surgical History:  Procedure Laterality Date  . BILATERAL TOTAL MASTECTOMY WITH AXILLARY LYMPH NODE DISSECTION  09/04/2017  . LEEP       FAMILY HISTORY: family history includes Breast cancer in her maternal aunt and paternal aunt; Diabetes in her father; Emphysema in her mother; Pancreatic cancer in her maternal grandmother.   SOCIAL HISTORY:  reports that she has been smoking cigarettes. She has been smoking about 0.50 packs per day. She has never used smokeless tobacco. She reports current alcohol use.   ALLERGIES: Pantoprazole, Adhesive  [tape], Avelox [moxifloxacin hcl in nacl], and Ciprofloxacin   MEDICATIONS:  Current Outpatient Medications  Medication Sig Dispense Refill  . aminocaproic acid (AMICAR)  500 MG tablet Take 1 tablet (500 mg total) by mouth every 8 (eight) hours. 90 tablet 4  . amoxicillin-clavulanate (AUGMENTIN) 875-125 MG tablet Take 1 tablet by mouth 2 (two) times daily. 14 tablet 0  . hydrOXYzine (ATARAX/VISTARIL) 25 MG tablet TAKE 1 TABLET (25 MG TOTAL) BY MOUTH AT BEDTIME AND MAY REPEAT DOSE ONE TIME IF NEEDED. 180 tablet 1  . mirtazapine (REMERON) 7.5 MG tablet Take 1 tablet (7.5 mg total) by mouth at bedtime. 30 tablet 1  . ondansetron (ZOFRAN-ODT) 8 MG disintegrating tablet Take by mouth.    . prochlorperazine (COMPAZINE) 10 MG tablet Take 0.5 tablets (5 mg total) by mouth every 6 (six) hours as needed for nausea or vomiting. 30 tablet   . promethazine (PHENERGAN) 25 MG suppository Place 1 suppository (25 mg total) rectally every 6 (six) hours as needed for nausea or vomiting. 12 each 3  . valACYclovir (VALTREX) 500 MG tablet 1 tablet bid x 3 days     No current facility-administered medications for this encounter.     REVIEW OF SYSTEMS:  A 15 point review of systems is documented in the electronic medical record. This was obtained by the nursing staff. However, I reviewed this with the patient to discuss relevant findings and make appropriate changes.  Pertinent items are noted in HPI.    PHYSICAL EXAM:  height is 5\' 2"  (1.575 m) and weight is 119 lb (54 kg).   ECOG = 2  0 - Asymptomatic (Fully active, able to carry on all predisease activities without restriction)  1 - Symptomatic but completely ambulatory (  Restricted in physically strenuous activity but ambulatory and able to carry out work of a light or sedentary nature. For example, light housework, office work)  2 - Symptomatic, <50% in bed during the day (Ambulatory and capable of all self care but unable to carry out any work activities. Up and about more than 50% of waking hours)  3 - Symptomatic, >50% in bed, but not bedbound (Capable of only limited self-care, confined to bed or chair 50% or more of waking  hours)  4 - Bedbound (Completely disabled. Cannot carry on any self-care. Totally confined to bed or chair)  5 - Death   Eustace Pen MM, Creech RH, Tormey DC, et al. [phone]). "Toxicity and response criteria of the [facility]". [place] Oncol. 5 (6): 649-55     LABORATORY DATA:  Lab Results  Component Value Date   WBC 9.2 06/10/2020   HGB 8.8 (L) 06/10/2020   HCT 27.0 (L) 06/10/2020   MCV 87.7 06/10/2020   PLT 10 (L) 06/10/2020   Lab Results  Component Value Date   NA 137 06/10/2020   K 4.2 06/10/2020   CL 108 06/10/2020   CO2 18 (L) 06/10/2020   Lab Results  Component Value Date   ALT 127 (H) 06/10/2020   AST 587 (HH) 06/10/2020   ALKPHOS 261 (H) 06/10/2020   BILITOT 1.6 (H) 06/10/2020      RADIOGRAPHY: DG Chest 2 View  Result Date: 06/10/2020 CLINICAL DATA:  History of metastatic breast cancer with decreased breath sounds over the left lower lung. EXAM: CHEST - 2 VIEW COMPARISON:  Apr 08, 2017 FINDINGS: There is stable right-sided venous Port-A-Cath positioning. Moderate severity atelectasis and/or infiltrate is seen within the left lung base. There is a small left pleural effusion. No pneumothorax is identified. The heart size and mediastinal contours are within normal limits. The visualized skeletal structures are unremarkable. IMPRESSION: 1. Moderate severity left basilar atelectasis and/or infiltrate. 2. Small left pleural effusion. 3. Stable right-sided venous Port-A-Cath positioning. Electronically Signed   By: Virgina Norfolk M.D.   On: 06/10/2020 15:02   MR Brain W Wo Contrast  Result Date: 05/30/2020 CLINICAL DATA:  Breast cancer. PET scan demonstrates focal uptake in the left periventricular white matter EXAM: MRI HEAD WITHOUT AND WITH CONTRAST TECHNIQUE: Multiplanar, multiecho pulse sequences of the brain and surrounding structures were obtained without and with intravenous contrast. CONTRAST:  58mL GADAVIST GADOBUTROL 1 MMOL/ML IV SOLN  COMPARISON:  PET scan 04/28/2020. FINDINGS: Brain: Multiple foci of enhancement are present, consistent with metastatic disease. A focal lesion within the posterosuperior left occipital lobe measures 10 x 6 x 11 mm on image 106 of series 16. In adjacent lesion measures 6 mm. Marked surrounding edema is present with diffuse leptomeningeal enhancement. Smaller cortical based lesions in the left parietal lobe on image 108 measure 4 mm, anterior left parietal lobe on image 115 measure 1-2 mm and multiple subcentimeter lesions in the left occipital lobe. Posterior right parietal solid enhancing lesion measures 7 x 9 x 6 mm. 5 mm right occipital lobe lesion is noted on image 81 2 mm more posterior right occipital lesion is present on image 82. Multiple punctate foci of enhancement are present throughout both cerebellar hemispheres. A 5 mm lesion in the inferior cerebellar vermis is the largest lesion. Over 20 punctate lesions are present. Left a meningeal enhancement is present along the superior vermis. No significant anterior lesions are present. Edema is most profound in the left parietal and  occipital lobe. There is some edema in the posterior right parietal lobe as well. Minimal T2 signal is associated with the scattered cerebellar lesions, most noted at the superior vermis. No acute hemorrhage is present. No acute infarct is present. Mild periventricular T2 signal changes are otherwise within normal limits for age. The ventricles are of normal size. No significant extraaxial fluid collection is present. Vascular: Flow is present in the major intracranial arteries. Skull and upper cervical spine: Enhancement in the right occipital calvarium with some expansion is compatible with focal metastasis. Other smaller areas of posterior parietal skull enhancement are noted. This corresponds with the uptake on PET scan. Sinuses/Orbits: The paranasal sinuses and mastoid air cells are clear. The globes and orbits are within  normal limits. IMPRESSION: 1. Multiple foci of enhancement consistent with metastatic disease to the brain. 2. Marked surrounding edema with diffuse leptomeningeal enhancement in the left parietal and occipital lobe related to both parenchymal and leptomeningeal metastases. 3. Multiple punctate metastases throughout both cerebellar hemispheres. The largest lesion is in the inferior vermis. Left a meningeal disease is present along the superior vermis. 4. Right occipital calvarial metastasis with some expansion. This corresponds with the uptake on PET scan. Electronically Signed   By: San Morelle M.D.   On: 05/30/2020 05:00   MR LIVER W WO CONTRAST  Result Date: 05/19/2020 CLINICAL DATA:  Lobular breast cancer. Elevated liver function tests. Evaluate for liver metastasis. EXAM: MRI ABDOMEN WITHOUT AND WITH CONTRAST TECHNIQUE: Multiplanar multisequence MR imaging of the abdomen was performed both before and after the administration of intravenous contrast. CONTRAST:  62mL GADAVIST GADOBUTROL 1 MMOL/ML IV SOLN COMPARISON:  [facility] PET 02/18/2020. [facility] 03/17/2017 abdominopelvic CT. FINDINGS: Portions of exam are mildly motion degraded. Lower chest: Moderate left and small right pleural effusions. Normal heart size. Bilateral breast implants. Hepatobiliary: High left hepatic lobe 8 mm cyst, present back 03/17/2017. No suspicious liver lesion. No significant stenosis. Normal gallbladder, without biliary ductal dilatation. Pancreas:  Normal, without mass or ductal dilatation. Spleen:  Borderline splenomegaly, 12.9 cm craniocaudal. Adrenals/Urinary Tract: Normal adrenal glands. Normal kidneys, without hydronephrosis. Stomach/Bowel: Normal stomach and abdominal bowel loops. Vascular/Lymphatic: Aortic atherosclerosis. No retroperitoneal or retrocrural adenopathy. Other:  No ascites. Musculoskeletal: Relatively diffuse osseous metastasis, as evidenced by heterogeneous enhancement  throughout. IMPRESSION: 1. Mildly motion degraded exam. 2. Given this limitation, no evidence of hepatic metastasis or explanation for elevated liver function tests. 3. Widespread osseous metastasis. 4. Left larger than right pleural effusions. 5.  Aortic Atherosclerosis (ICD10-I70.0). Electronically Signed   By: Abigail Miyamoto M.D.   On: 05/19/2020 14:09       IMPRESSION/ PLAN:  The patient has stage IV breast cancer with recent findings of multiple brain metastases and leptomeningeal disease.  No further aggressive treatment has been recommended by medical oncology and the patient is enrolling in hospice most likely in the near future.  We discussed the potential short course of whole brain radiation treatment to try to improve local control within the CNS.  I discussed the rationale of such a treatment as well as the possible side effects and risks.  All of the patient's questions were answered and she does wish to proceed with this treatment.  She will undergo simulation tomorrow for treatment planning and I anticipate beginning a 5 fraction course of whole brain radiation treatment next week on Monday and we will finish this next week as well.  Given current concerns for patient exposure during the COVID-19 pandemic,  this encounter was conducted via telephone.  The patient has given verbal consent for this type of encounter. The time spent during this encounter today was 30 minutes including review of medical records, discussion with the patient, and coordination of care. The attendants for this meeting included Dr. Lisbeth Renshaw, and the patient and the patient's husband  During the encounter Dr. Lisbeth Renshaw was located at [facility] Department.  The patient  was located at home.        ________________________________   Jodelle Gross, MD, PhD   **Disclaimer: This note was dictated with voice recognition software. Similar sounding words can inadvertently be  transcribed and this note may contain transcription errors which may not have been corrected upon publication of note.**

## 2020-06-16 ENCOUNTER — Ambulatory Visit
Admission: RE | Admit: 2020-06-16 | Discharge: 2020-06-16 | Disposition: A | Discharge status: Home / Self Care | Payer: Medicare HMO | Source: Ambulatory Visit | Attending: Radiation Oncology | Admitting: Radiation Oncology

## 2020-06-16 ENCOUNTER — Other Ambulatory Visit: Payer: Self-pay

## 2020-06-16 DIAGNOSIS — Z510 Encounter for antineoplastic radiation therapy: Secondary | ICD-10-CM | POA: Diagnosis not present

## 2020-06-16 DIAGNOSIS — C7951 Secondary malignant neoplasm of bone: Secondary | ICD-10-CM | POA: Diagnosis not present

## 2020-06-16 DIAGNOSIS — C7932 Secondary malignant neoplasm of cerebral meninges: Secondary | ICD-10-CM | POA: Diagnosis present

## 2020-06-16 DIAGNOSIS — C50812 Malignant neoplasm of overlapping sites of left female breast: Secondary | ICD-10-CM | POA: Diagnosis present

## 2020-06-16 DIAGNOSIS — C773 Secondary and unspecified malignant neoplasm of axilla and upper limb lymph nodes: Secondary | ICD-10-CM | POA: Diagnosis not present

## 2020-06-17 ENCOUNTER — Telehealth: Payer: Self-pay | Admitting: Radiation Oncology

## 2020-06-17 ENCOUNTER — Other Ambulatory Visit: Payer: Self-pay | Admitting: Oncology

## 2020-06-17 ENCOUNTER — Ambulatory Visit: Payer: Medicare HMO

## 2020-06-17 NOTE — Telephone Encounter (Signed)
I was notified the the patient's family called to cancel her remaining appointments. I called her husband and he indicated that the hospice nurses feels he has only a few days prognosis and for that reason they would prefer to cancel remaining treatments. I am in agreement and have let our staff know to EOT her treatment plan and encouraged her husband to let us know if there is anything we can do to reach out to our clinic.

## 2020-06-18 ENCOUNTER — Other Ambulatory Visit: Payer: Self-pay | Admitting: Hematology

## 2020-06-18 ENCOUNTER — Ambulatory Visit: Payer: Medicare HMO

## 2020-06-18 DIAGNOSIS — D649 Anemia, unspecified: Secondary | ICD-10-CM

## 2020-06-18 DIAGNOSIS — C7951 Secondary malignant neoplasm of bone: Secondary | ICD-10-CM

## 2020-06-18 DIAGNOSIS — D696 Thrombocytopenia, unspecified: Secondary | ICD-10-CM

## 2020-06-19 ENCOUNTER — Ambulatory Visit: Payer: Medicare HMO

## 2020-06-19 NOTE — Telephone Encounter (Signed)
Disregard

## 2020-06-20 ENCOUNTER — Ambulatory Visit: Payer: Medicare HMO

## 2020-06-21 ENCOUNTER — Other Ambulatory Visit: Payer: Self-pay | Admitting: Oncology

## 2020-06-21 NOTE — Progress Notes (Signed)
I called Joe to get an update on Jamie Hartman and he tells me that she died on 07/06/20.  He tells me that the and was peaceful and they did have help from hospice.

## 2020-06-26 ENCOUNTER — Other Ambulatory Visit: Payer: Medicare HMO

## 2020-06-26 ENCOUNTER — Ambulatory Visit: Payer: Medicare HMO

## 2020-07-06 DEATH — deceased

## 2020-07-24 ENCOUNTER — Ambulatory Visit: Payer: Medicare HMO

## 2020-07-24 ENCOUNTER — Other Ambulatory Visit: Payer: Medicare HMO

## 2020-08-21 ENCOUNTER — Other Ambulatory Visit: Payer: Medicare HMO

## 2020-08-21 ENCOUNTER — Ambulatory Visit: Payer: Medicare HMO

## 2020-09-18 ENCOUNTER — Ambulatory Visit: Payer: Medicare HMO

## 2020-09-18 ENCOUNTER — Other Ambulatory Visit: Payer: Medicare HMO

## 2020-10-16 ENCOUNTER — Other Ambulatory Visit: Payer: Medicare HMO

## 2020-10-16 ENCOUNTER — Ambulatory Visit: Payer: Medicare HMO

## 2020-11-13 ENCOUNTER — Other Ambulatory Visit: Payer: Medicare HMO

## 2020-11-13 ENCOUNTER — Ambulatory Visit: Payer: Medicare HMO

## 2021-04-21 IMAGING — MR MR HEAD WO/W CM
14 series · 48 of 48 positions shown · IV contrast (gadavist)
Comparison: PET scan 04/28/2020.

CLINICAL DATA: Breast cancer. PET scan demonstrates focal uptake in
the left periventricular white matter

EXAM:
MRI HEAD WITHOUT AND WITH CONTRAST
TECHNIQUE: Multiplanar, multiecho pulse sequences of the brain and surrounding
structures were obtained without and with intravenous contrast.
CONTRAST:  6mL GADAVIST GADOBUTROL 1 MMOL/ML IV SOLN

[Series 5: DWI · axial · 3.0mm · 1.25mm/px · z∈[-24,+117]mm · 5 of 96 slices shown (1 of 4)]
[im 1/96]
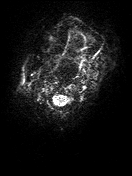
[im 24/96]
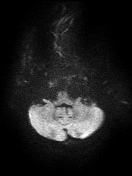
[im 48/96]
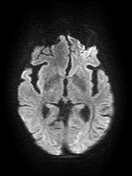
[im 72/96]
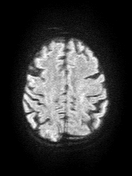
[im 96/96]
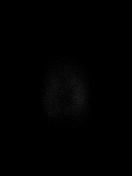

[Series 6: DWI · axial · 3.0mm · 1.25mm/px · z∈[-24,+117]mm · 3 of 48 slices shown (2 of 4)]
[im 1/48]
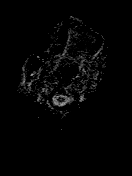
[im 24/48]
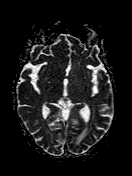
[im 48/48]
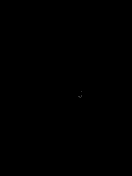

[Series 7: T1 · sagittal · 5.0mm · 0.75mm/px · 1 of 22 slices shown (1 of 2)]
[im 1/22]
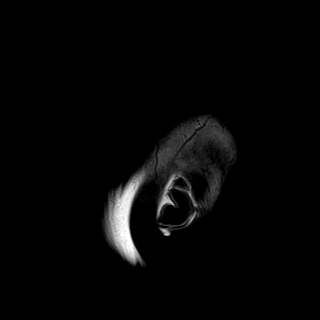

[Series 8: T2 · axial · 5.0mm · 0.57mm/px · 1 of 23 slices shown (1 of 2)]
[im 1/23]
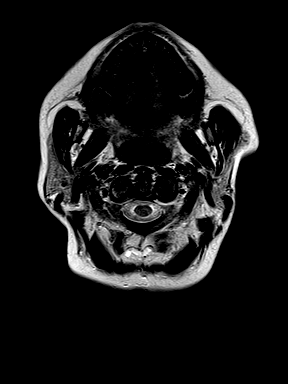

[Series 9: mip_images(sw) · axial · 24.0mm · 0.69mm/px · z∈[-22,+109]mm · 3 of 45 slices shown]
[im 1/45]
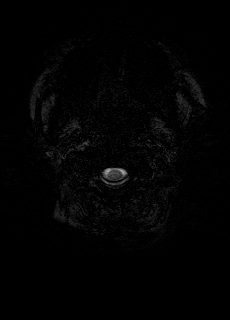
[im 23/45]
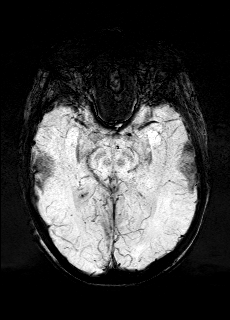
[im 45/45]
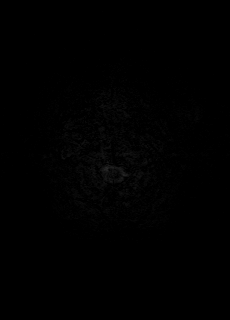

[Series 10: swi_images · axial · 3.0mm · 0.69mm/px · z∈[-33,+120]mm · 3 of 52 slices shown]
[im 1/52]
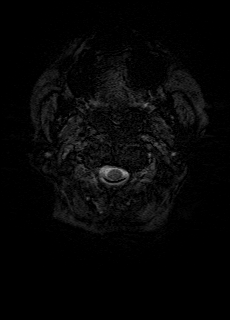
[im 26/52]
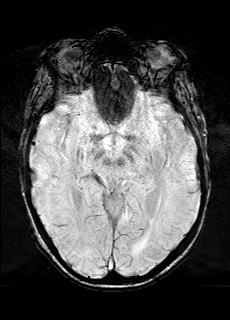
[im 52/52]
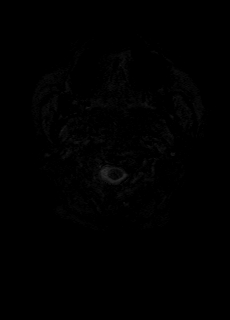

[Series 11: FLAIR · axial · 3.0mm · 0.69mm/px · z∈[-31,+118]mm · 3 of 51 slices shown]
[im 1/51]
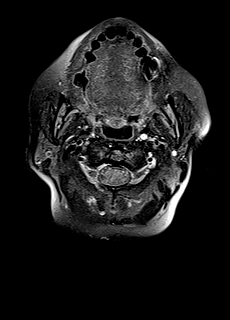
[im 26/51]
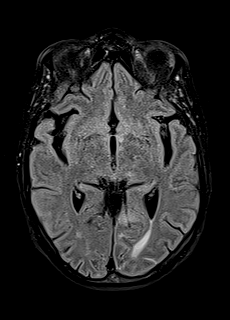
[im 51/51]
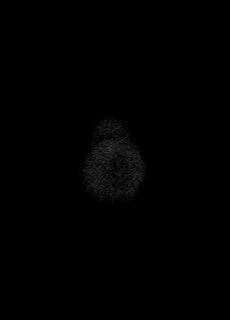

[Series 12: T1 · axial · 1.0mm · 0.86mm/px · z∈[-36,+123]mm · 9 of 160 slices shown (2 of 2)]
[im 1/160]
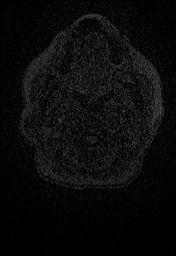
[im 20/160]
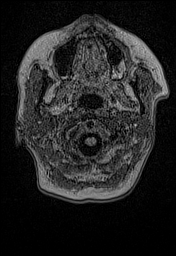
[im 40/160]
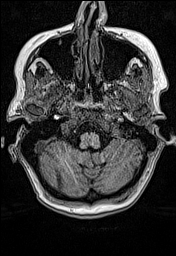
[im 60/160]
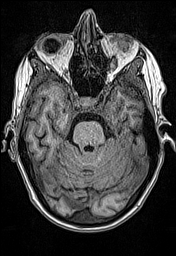
[im 80/160]
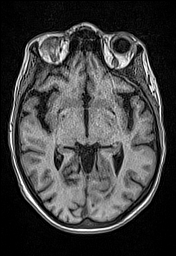
[im 100/160]
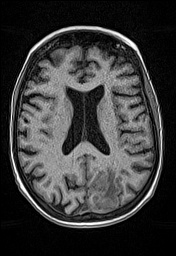
[im 120/160]
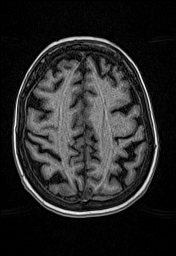
[im 140/160]
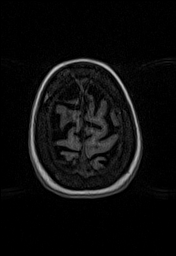
[im 160/160]
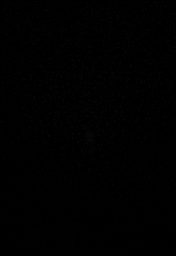

[Series 13: DWI · coronal · 5.0mm · 1.25mm/px · 4 of 64 slices shown (3 of 4)]
[im 1/64]
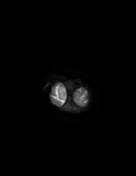
[im 22/64]
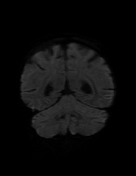
[im 43/64]
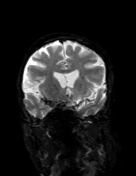
[im 64/64]
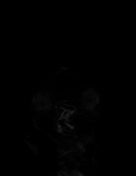

[Series 14: DWI · coronal · 5.0mm · 1.25mm/px · 2 of 31 slices shown (4 of 4)]
[im 1/31]
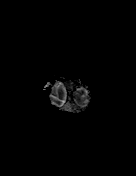
[im 31/31]
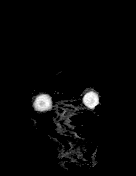

[Series 15: T2 · coronal · 5.0mm · 0.57mm/px · 2 of 26 slices shown (2 of 2)]
[im 1/26]
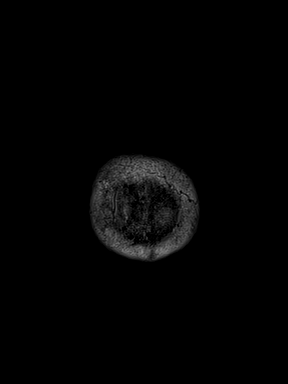
[im 26/26]
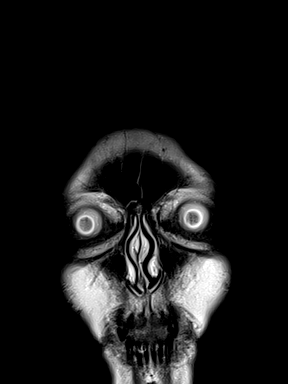

[Series 16: T1 post-contrast · axial · 1.0mm · 0.86mm/px · z∈[-36,+123]mm · 9 of 160 slices shown (1 of 3)]
[im 1/160]
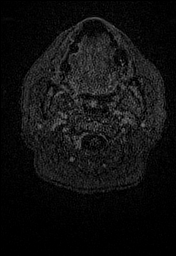
[im 20/160]
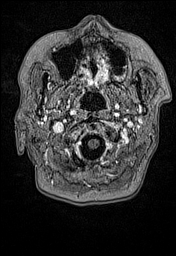
[im 40/160]
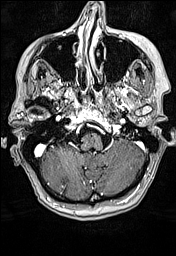
[im 60/160]
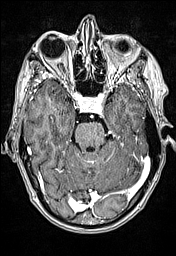
[im 80/160]
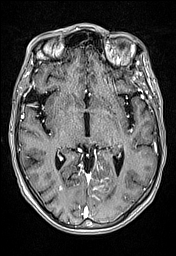
[im 100/160]
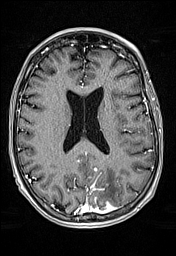
[im 120/160]
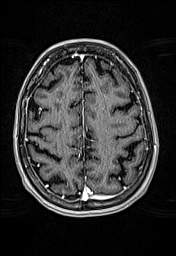
[im 140/160]
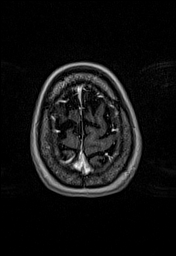
[im 160/160]
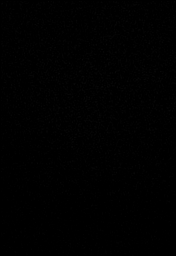

[Series 17: T1 post-contrast · coronal · 5.0mm · 0.43mm/px · 2 of 26 slices shown (2 of 3)]
[im 1/26]
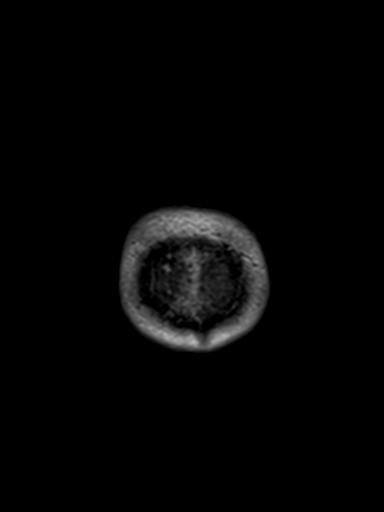
[im 26/26]
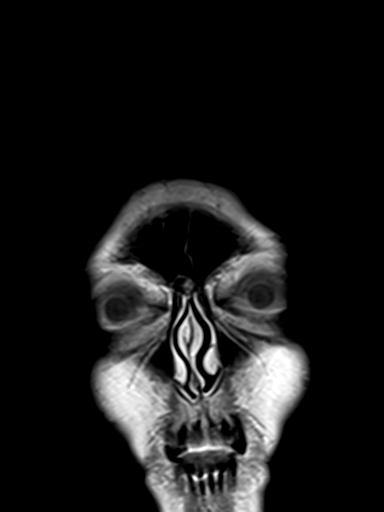

[Series 18: T1 post-contrast · sagittal · 5.0mm · 0.75mm/px · 1 of 22 slices shown (3 of 3)]
[im 1/22]
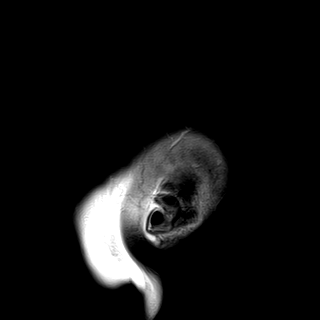

[48 of 48 positions shown; findings below may reference images not displayed]

FINDINGS: Brain: Multiple foci of enhancement are present, consistent with
metastatic disease. A focal lesion within the posterosuperior left
occipital lobe measures 10 x 6 x 11 mm on image 106 of series 16. In
adjacent lesion measures 6 mm. Marked surrounding edema is present
with diffuse leptomeningeal enhancement. Smaller cortical based
lesions in the left parietal lobe on image 108 measure 4 mm,
anterior left parietal lobe on image 115 measure 1-2 mm and multiple
subcentimeter lesions in the left occipital lobe.

Posterior right parietal solid enhancing lesion measures 7 x 9 x 6
mm. 5 mm right occipital lobe lesion is noted on image 81 2 mm more
posterior right occipital lesion is present on image 82.

Multiple punctate foci of enhancement are present throughout both
cerebellar hemispheres. A 5 mm lesion in the inferior cerebellar
vermis is the largest lesion. Over 20 punctate lesions are present.
Left a meningeal enhancement is present along the superior vermis.

No significant anterior lesions are present. Edema is most profound
in the left parietal and occipital lobe. There is some edema in the
posterior right parietal lobe as well. Minimal T2 signal is
associated with the scattered cerebellar lesions, most noted at the
superior vermis.

No acute hemorrhage is present. No acute infarct is present. Mild
periventricular T2 signal changes are otherwise within normal limits
for age. The ventricles are of normal size. No significant
extraaxial fluid collection is present.

Vascular: Flow is present in the major intracranial arteries.

Skull and upper cervical spine: Enhancement in the right occipital
calvarium with some expansion is compatible with focal metastasis.
Other smaller areas of posterior parietal skull enhancement are
noted. This corresponds with the uptake on PET scan.

Sinuses/Orbits: The paranasal sinuses and mastoid air cells are
clear. The globes and orbits are within normal limits.
IMPRESSION: 1. Multiple foci of enhancement consistent with metastatic disease
to the brain.
2. Marked surrounding edema with diffuse leptomeningeal enhancement
in the left parietal and occipital lobe related to both parenchymal
and leptomeningeal metastases.
3. Multiple punctate metastases throughout both cerebellar
hemispheres. The largest lesion is in the inferior vermis. Left a
meningeal disease is present along the superior vermis.
4. Right occipital calvarial metastasis with some expansion. This
corresponds with the uptake on PET scan.
